# Patient Record
Sex: Female | Born: 1965
Health system: Southern US, Community
[De-identification: ages and names within clinical notes are randomized; demographics above are authoritative.]

## PROBLEM LIST (undated history)

## (undated) DIAGNOSIS — G4733 Obstructive sleep apnea (adult) (pediatric): Secondary | ICD-10-CM

## (undated) DIAGNOSIS — G43909 Migraine, unspecified, not intractable, without status migrainosus: Secondary | ICD-10-CM

## (undated) DIAGNOSIS — Z85828 Personal history of other malignant neoplasm of skin: Secondary | ICD-10-CM

## (undated) DIAGNOSIS — B019 Varicella without complication: Secondary | ICD-10-CM

## (undated) DIAGNOSIS — C801 Malignant (primary) neoplasm, unspecified: Secondary | ICD-10-CM

## (undated) DIAGNOSIS — K51419 Inflammatory polyps of colon with unspecified complications: Secondary | ICD-10-CM

## (undated) DIAGNOSIS — Z853 Personal history of malignant neoplasm of breast: Secondary | ICD-10-CM

## (undated) HISTORY — PX: TONSILLECTOMY AND ADENOIDECTOMY: SHX28

## (undated) HISTORY — DX: Obstructive sleep apnea (adult) (pediatric): G47.33

## (undated) HISTORY — DX: Personal history of malignant neoplasm of breast: Z85.3

## (undated) HISTORY — PX: BREAST LUMPECTOMY: SHX2

## (undated) HISTORY — PX: BREAST BIOPSY: SHX20

## (undated) HISTORY — PX: VARICOSE VEIN SURGERY: SHX832

## (undated) HISTORY — PX: TYMPANOSTOMY TUBE PLACEMENT: SHX32

## (undated) HISTORY — DX: Inflammatory polyps of colon with unspecified complications: K51.419

## (undated) HISTORY — DX: Malignant (primary) neoplasm, unspecified: C80.1

## (undated) HISTORY — DX: Migraine, unspecified, not intractable, without status migrainosus: G43.909

## (undated) HISTORY — PX: INGUINAL HERNIA REPAIR: SUR1180

## (undated) HISTORY — DX: Varicella without complication: B01.9

## (undated) HISTORY — DX: Personal history of other malignant neoplasm of skin: Z85.828

## (undated) HISTORY — PX: NASAL SINUS SURGERY: SHX719

---

## 1998-08-26 LAB — HM HEPATITIS C SCREENING LAB: HM Hepatitis Screen: NEGATIVE

## 1998-08-26 LAB — HM HIV SCREENING LAB: HM HIV Screening: NEGATIVE

## 2019-05-24 DIAGNOSIS — Z6821 Body mass index (BMI) 21.0-21.9, adult: Secondary | ICD-10-CM | POA: Diagnosis not present

## 2019-05-24 DIAGNOSIS — G43009 Migraine without aura, not intractable, without status migrainosus: Secondary | ICD-10-CM | POA: Diagnosis not present

## 2019-05-24 DIAGNOSIS — Z13220 Encounter for screening for lipoid disorders: Secondary | ICD-10-CM | POA: Diagnosis not present

## 2019-05-24 DIAGNOSIS — Z23 Encounter for immunization: Secondary | ICD-10-CM | POA: Diagnosis not present

## 2019-05-24 DIAGNOSIS — Z0000 Encounter for general adult medical examination without abnormal findings: Secondary | ICD-10-CM | POA: Diagnosis not present

## 2019-05-26 DIAGNOSIS — Z131 Encounter for screening for diabetes mellitus: Secondary | ICD-10-CM | POA: Diagnosis not present

## 2019-05-26 DIAGNOSIS — Z0000 Encounter for general adult medical examination without abnormal findings: Secondary | ICD-10-CM | POA: Diagnosis not present

## 2019-05-26 DIAGNOSIS — Z1329 Encounter for screening for other suspected endocrine disorder: Secondary | ICD-10-CM | POA: Diagnosis not present

## 2019-05-26 DIAGNOSIS — Z13220 Encounter for screening for lipoid disorders: Secondary | ICD-10-CM | POA: Diagnosis not present

## 2019-09-01 DIAGNOSIS — G43829 Menstrual migraine, not intractable, without status migrainosus: Secondary | ICD-10-CM | POA: Diagnosis not present

## 2019-09-01 DIAGNOSIS — R030 Elevated blood-pressure reading, without diagnosis of hypertension: Secondary | ICD-10-CM | POA: Diagnosis not present

## 2019-10-20 DIAGNOSIS — L570 Actinic keratosis: Secondary | ICD-10-CM | POA: Diagnosis not present

## 2019-10-20 DIAGNOSIS — D485 Neoplasm of uncertain behavior of skin: Secondary | ICD-10-CM | POA: Diagnosis not present

## 2019-10-20 DIAGNOSIS — L821 Other seborrheic keratosis: Secondary | ICD-10-CM | POA: Diagnosis not present

## 2019-10-20 DIAGNOSIS — C44612 Basal cell carcinoma of skin of right upper limb, including shoulder: Secondary | ICD-10-CM | POA: Diagnosis not present

## 2019-11-03 DIAGNOSIS — Z01419 Encounter for gynecological examination (general) (routine) without abnormal findings: Secondary | ICD-10-CM | POA: Diagnosis not present

## 2019-11-03 DIAGNOSIS — N898 Other specified noninflammatory disorders of vagina: Secondary | ICD-10-CM | POA: Diagnosis not present

## 2019-11-03 DIAGNOSIS — Z853 Personal history of malignant neoplasm of breast: Secondary | ICD-10-CM | POA: Diagnosis not present

## 2019-11-24 DIAGNOSIS — Z853 Personal history of malignant neoplasm of breast: Secondary | ICD-10-CM | POA: Diagnosis not present

## 2019-11-24 DIAGNOSIS — R922 Inconclusive mammogram: Secondary | ICD-10-CM | POA: Diagnosis not present

## 2019-11-24 DIAGNOSIS — Z08 Encounter for follow-up examination after completed treatment for malignant neoplasm: Secondary | ICD-10-CM | POA: Diagnosis not present

## 2019-11-24 DIAGNOSIS — Z9889 Other specified postprocedural states: Secondary | ICD-10-CM | POA: Diagnosis not present

## 2019-12-09 DIAGNOSIS — Z86010 Personal history of colonic polyps: Secondary | ICD-10-CM | POA: Diagnosis not present

## 2019-12-09 LAB — HM COLONOSCOPY

## 2019-12-22 DIAGNOSIS — L309 Dermatitis, unspecified: Secondary | ICD-10-CM | POA: Diagnosis not present

## 2019-12-22 DIAGNOSIS — L821 Other seborrheic keratosis: Secondary | ICD-10-CM | POA: Diagnosis not present

## 2020-04-18 DIAGNOSIS — B078 Other viral warts: Secondary | ICD-10-CM | POA: Diagnosis not present

## 2020-04-18 DIAGNOSIS — L821 Other seborrheic keratosis: Secondary | ICD-10-CM | POA: Diagnosis not present

## 2020-04-18 DIAGNOSIS — L578 Other skin changes due to chronic exposure to nonionizing radiation: Secondary | ICD-10-CM | POA: Diagnosis not present

## 2020-04-18 DIAGNOSIS — D225 Melanocytic nevi of trunk: Secondary | ICD-10-CM | POA: Diagnosis not present

## 2020-04-18 DIAGNOSIS — B351 Tinea unguium: Secondary | ICD-10-CM | POA: Diagnosis not present

## 2020-04-18 DIAGNOSIS — L905 Scar conditions and fibrosis of skin: Secondary | ICD-10-CM | POA: Diagnosis not present

## 2020-04-18 DIAGNOSIS — D485 Neoplasm of uncertain behavior of skin: Secondary | ICD-10-CM | POA: Diagnosis not present

## 2020-04-26 DIAGNOSIS — Z13220 Encounter for screening for lipoid disorders: Secondary | ICD-10-CM | POA: Diagnosis not present

## 2020-04-26 DIAGNOSIS — Z1389 Encounter for screening for other disorder: Secondary | ICD-10-CM | POA: Diagnosis not present

## 2020-04-26 DIAGNOSIS — Z0000 Encounter for general adult medical examination without abnormal findings: Secondary | ICD-10-CM | POA: Diagnosis not present

## 2020-06-28 DIAGNOSIS — G43909 Migraine, unspecified, not intractable, without status migrainosus: Secondary | ICD-10-CM | POA: Diagnosis not present

## 2020-06-28 DIAGNOSIS — Z0000 Encounter for general adult medical examination without abnormal findings: Secondary | ICD-10-CM | POA: Diagnosis not present

## 2020-06-28 DIAGNOSIS — N951 Menopausal and female climacteric states: Secondary | ICD-10-CM | POA: Diagnosis not present

## 2020-09-06 DIAGNOSIS — H6123 Impacted cerumen, bilateral: Secondary | ICD-10-CM | POA: Diagnosis not present

## 2020-10-25 DIAGNOSIS — L308 Other specified dermatitis: Secondary | ICD-10-CM | POA: Diagnosis not present

## 2020-10-25 DIAGNOSIS — L718 Other rosacea: Secondary | ICD-10-CM | POA: Diagnosis not present

## 2020-10-25 DIAGNOSIS — L905 Scar conditions and fibrosis of skin: Secondary | ICD-10-CM | POA: Diagnosis not present

## 2020-10-25 DIAGNOSIS — D225 Melanocytic nevi of trunk: Secondary | ICD-10-CM | POA: Diagnosis not present

## 2020-11-15 DIAGNOSIS — Z01419 Encounter for gynecological examination (general) (routine) without abnormal findings: Secondary | ICD-10-CM | POA: Diagnosis not present

## 2020-11-15 DIAGNOSIS — Z1231 Encounter for screening mammogram for malignant neoplasm of breast: Secondary | ICD-10-CM | POA: Diagnosis not present

## 2020-11-15 DIAGNOSIS — Z8669 Personal history of other diseases of the nervous system and sense organs: Secondary | ICD-10-CM | POA: Diagnosis not present

## 2020-11-15 LAB — HM PAP SMEAR: HM Pap smear: NORMAL

## 2021-01-08 DIAGNOSIS — E538 Deficiency of other specified B group vitamins: Secondary | ICD-10-CM | POA: Diagnosis not present

## 2021-01-08 DIAGNOSIS — E038 Other specified hypothyroidism: Secondary | ICD-10-CM | POA: Diagnosis not present

## 2021-01-08 DIAGNOSIS — E559 Vitamin D deficiency, unspecified: Secondary | ICD-10-CM | POA: Diagnosis not present

## 2021-01-08 DIAGNOSIS — G43839 Menstrual migraine, intractable, without status migrainosus: Secondary | ICD-10-CM | POA: Diagnosis not present

## 2021-03-08 DIAGNOSIS — Z1231 Encounter for screening mammogram for malignant neoplasm of breast: Secondary | ICD-10-CM | POA: Diagnosis not present

## 2021-03-21 ENCOUNTER — Other Ambulatory Visit: Payer: Self-pay

## 2021-03-22 ENCOUNTER — Encounter: Payer: Self-pay | Admitting: Family Medicine

## 2021-03-22 ENCOUNTER — Ambulatory Visit (INDEPENDENT_AMBULATORY_CARE_PROVIDER_SITE_OTHER): Payer: BC Managed Care – PPO | Admitting: Family Medicine

## 2021-03-22 VITALS — BP 122/82 | HR 68 | Temp 98.4°F | Ht 70.0 in | Wt 166.0 lb

## 2021-03-22 DIAGNOSIS — G43909 Migraine, unspecified, not intractable, without status migrainosus: Secondary | ICD-10-CM

## 2021-03-22 DIAGNOSIS — Z8249 Family history of ischemic heart disease and other diseases of the circulatory system: Secondary | ICD-10-CM | POA: Diagnosis not present

## 2021-03-22 DIAGNOSIS — Z0000 Encounter for general adult medical examination without abnormal findings: Secondary | ICD-10-CM

## 2021-03-22 DIAGNOSIS — M79673 Pain in unspecified foot: Secondary | ICD-10-CM

## 2021-03-22 DIAGNOSIS — Z7189 Other specified counseling: Secondary | ICD-10-CM

## 2021-03-22 NOTE — Progress Notes (Signed)
This visit occurred during the SARS-CoV-2 public health emergency.  Safety protocols were in place, including screening questions prior to the visit, additional usage of staff PPE, and extensive cleaning of exam room while observing appropriate contact time as indicated for disinfecting solutions.  New patient.  Requesting old records.  Family history of heart disease.  Discussed potentially getting calcium scoring done.  Requesting records from previous doctor.  No chest pain shortness of breath or swelling.  Migraines.  Followed by neurology.  Imitrex helps.  Used prn.  This may be menstrual related and may improve as she goes through menopause.  Discussed.  History of right fifth toe fracture years ago.  She has orthotics to use.  She still having some discomfort episodically.   HIV and hepatitis C screening done in the 2000's, negative per patient report. Vaccine history updated. Living will d/w pt.  Husband then brother Mikki Santee designated if patient were incapacitated.   Pap done 10/2020.   Colonoscopy 2021 Diet and exercise d/w pt  Meds, vitals, and allergies reviewed.   ROS: Per HPI unless specifically indicated in ROS section   GEN: nad, alert and oriented HEENT: ncat NECK: supple w/o LA CV: rrr. PULM: ctab, no inc wob ABD: soft, +bs EXT: no edema SKIN: no acute rash Loss of right transverse arch.  Intact dorsalis pedis pulse.  Not tender palpation on any bony prominences.  No rash.  No bruising.

## 2021-03-22 NOTE — Patient Instructions (Signed)
Let me see about options for your foot.  I'll check on your old records and update your chart.   Let me give some consideration to calcium scoring.  Take care.  Glad to see you.

## 2021-03-26 ENCOUNTER — Encounter: Payer: Self-pay | Admitting: Family Medicine

## 2021-03-27 ENCOUNTER — Encounter: Payer: Self-pay | Admitting: Family Medicine

## 2021-03-27 DIAGNOSIS — Z0000 Encounter for general adult medical examination without abnormal findings: Secondary | ICD-10-CM | POA: Insufficient documentation

## 2021-03-27 DIAGNOSIS — Z853 Personal history of malignant neoplasm of breast: Secondary | ICD-10-CM | POA: Insufficient documentation

## 2021-03-27 DIAGNOSIS — Z7189 Other specified counseling: Secondary | ICD-10-CM | POA: Insufficient documentation

## 2021-03-27 DIAGNOSIS — G43909 Migraine, unspecified, not intractable, without status migrainosus: Secondary | ICD-10-CM | POA: Insufficient documentation

## 2021-03-27 DIAGNOSIS — Z85828 Personal history of other malignant neoplasm of skin: Secondary | ICD-10-CM | POA: Insufficient documentation

## 2021-03-27 DIAGNOSIS — Z8249 Family history of ischemic heart disease and other diseases of the circulatory system: Secondary | ICD-10-CM | POA: Insufficient documentation

## 2021-03-27 DIAGNOSIS — M79673 Pain in unspecified foot: Secondary | ICD-10-CM | POA: Insufficient documentation

## 2021-03-27 NOTE — Assessment & Plan Note (Signed)
No chest pain.  Requesting old records.  I like to consider options.  Calcium scoring may be reasonable given her age and family history.  Discussed.

## 2021-03-27 NOTE — Assessment & Plan Note (Signed)
HIV and hepatitis C screening done in the 2000's, negative per patient report. Vaccine history updated. Living will d/w pt.  Husband then brother Mikki Santee designated if patient were incapacitated.   Pap done 10/2020.   Colonoscopy 2021 Diet and exercise d/w pt

## 2021-03-27 NOTE — Assessment & Plan Note (Signed)
Per neurology.  This may decrease that she goes to menopause.  Using Imitrex..  Adding magnesium help.

## 2021-03-27 NOTE — Assessment & Plan Note (Signed)
On to consider options about podiatry versus sports medicine and we will contact patient.

## 2021-03-27 NOTE — Assessment & Plan Note (Signed)
Living will d/w pt.  Husband then brother Mikki Santee designated if patient were incapacitated.

## 2021-03-28 ENCOUNTER — Other Ambulatory Visit: Payer: Self-pay | Admitting: Family Medicine

## 2021-03-28 DIAGNOSIS — Z8249 Family history of ischemic heart disease and other diseases of the circulatory system: Secondary | ICD-10-CM

## 2021-04-09 ENCOUNTER — Ambulatory Visit (INDEPENDENT_AMBULATORY_CARE_PROVIDER_SITE_OTHER): Payer: BC Managed Care – PPO | Admitting: Family Medicine

## 2021-04-09 ENCOUNTER — Encounter: Payer: Self-pay | Admitting: Family Medicine

## 2021-04-09 ENCOUNTER — Other Ambulatory Visit: Payer: Self-pay

## 2021-04-09 VITALS — BP 102/70 | HR 72 | Temp 97.0°F | Ht 70.0 in | Wt 164.5 lb

## 2021-04-09 DIAGNOSIS — M7741 Metatarsalgia, right foot: Secondary | ICD-10-CM | POA: Diagnosis not present

## 2021-04-09 DIAGNOSIS — M722 Plantar fascial fibromatosis: Secondary | ICD-10-CM | POA: Diagnosis not present

## 2021-04-09 NOTE — Progress Notes (Signed)
Diane Rosero T. Shirlee Whitmire, MD, [place] at [facility], [address]  Phone: [phone]  FAX: [phone]  Diane White - 55 y.o. female  MRN [id number]  Date of Birth: 10-04-1965  Date: 04/09/2021  PCP: Tonia Ghent, MD  Referral: Tonia Ghent, MD  Chief Complaint  Patient presents with   Foot Pain    Right-Check Orthotic    This visit occurred during the SARS-CoV-2 public health emergency.  Safety protocols were in place, including screening questions prior to the visit, additional usage of staff PPE, and extensive cleaning of exam room while observing appropriate contact time as indicated for disinfecting solutions.   Subjective:   Diane White is a 55 y.o. very pleasant female patient with Body mass index is 23.6 kg/m. who presents with the following:  R foot pain:   She is here with some ongoing right intermittent foot pain.  She does have some pain around the lateral aspect of her foot approximately around the fourth and fifth metatarsal shafts distally.  She did have a remote fifth digit fracture on the right as well.  She also has some discomfort on the bottom of her right foot.  Nevertheless she is able to work out essentially every day and she is very active doing almost anything that she would like.  She does have 2 sets of orthotics that have a cork base as well as a Poron like material that is very forgiving  Last week and did not really bother her at all.   Orthotics look great  Tried a MT pad?  Review of Systems is noted in the HPI, as appropriate  Objective:   BP 102/70   Pulse 72   Temp (!) 97 F (36.1 C) (Temporal)   Ht '5\' 10"'$  (1.778 m)   Wt 164 lb 8 oz (74.6 kg)   SpO2 98%   BMI 23.60 kg/m   GEN: No acute distress; alert,appropriate. PULM: Breathing comfortably in no respiratory distress PSYCH: Normally interactive.   Bilaterally she does have almost complete  transverse arch collapse with bunion and notable bunionette formation with all metatarsal heads dropped.  She does have a natural cavus foot, but she does pronate on ambulation right greater than left.  Hindfoot appears to be stable and preserved.  Entirety of her bony foot and ankle exam is nontender for out bony pathology, all ligaments are stable.  Stable drawer testing and subtalar tilt testing.  Achilles, peroneal and other tibial tendons as well as all others appear grossly unremarkable.  She does have some pain about in the interdigital webspace and just caudal to this between the fourth and fifth.  She has some early thickening on the medial aspect in the midpoint of the plantar fascia.  Laboratory and Imaging Data:  Assessment and Plan:     ICD-10-CM   1. Metatarsalgia of right foot  M77.41     2. Plantar fascial fibromatosis of right foot  M72.2      I think that her orthotics would great.  If he will look really well, she has a nice cushioned orthotic that will be helpful for exercise.  She does have a thickening of the plantar fascia on the right which is not perceptible on the left.  She does not have a clear large fibroma, but there is a subtle change, and this will would likely be the cause of her pain in this region.  Transverse arch  collapse.  I think that she probably has some early neuroma formation at the very least some compression between 3 4 and 4 5.  I will do a trial of some metatarsal pads to see if this  Overall, offered reassurance.  I do not think that these are anything particularly worrisome, but they may bother her somewhat off and on.  Dragon Medical One speech-to-text software was used for transcription in this dictation.  Possible transcriptional errors can occur using Editor, commissioning.   Signed,  Maud Deed. Madalen Gavin, MD   Outpatient Encounter Medications as of 04/09/2021  Medication Sig   Calcium-Vitamin D-Vitamin K (CALCIUM SOFT CHEWS) [phone]  MG-UNT-MCG CHEW Chew 1 tablet by mouth daily.   Cholecalciferol 50 MCG (2000 UT) CAPS Take by mouth.   Magnesium 400 MG CAPS Take by mouth.   SUMAtriptan (IMITREX) 50 MG tablet Take by mouth.   No facility-administered encounter medications on file as of 04/09/2021.

## 2021-04-11 DIAGNOSIS — X32XXXA Exposure to sunlight, initial encounter: Secondary | ICD-10-CM | POA: Diagnosis not present

## 2021-04-11 DIAGNOSIS — L718 Other rosacea: Secondary | ICD-10-CM | POA: Diagnosis not present

## 2021-04-11 DIAGNOSIS — L570 Actinic keratosis: Secondary | ICD-10-CM | POA: Diagnosis not present

## 2021-04-13 ENCOUNTER — Ambulatory Visit
Admission: RE | Admit: 2021-04-13 | Discharge: 2021-04-13 | Disposition: A | Discharge status: Home / Self Care | Payer: BC Managed Care – PPO | Source: Ambulatory Visit | Attending: Family Medicine | Admitting: Family Medicine

## 2021-04-13 ENCOUNTER — Other Ambulatory Visit: Payer: Self-pay

## 2021-04-13 DIAGNOSIS — Z8249 Family history of ischemic heart disease and other diseases of the circulatory system: Secondary | ICD-10-CM | POA: Insufficient documentation

## 2021-05-04 ENCOUNTER — Ambulatory Visit: Payer: Self-pay | Admitting: Family Medicine

## 2021-05-15 ENCOUNTER — Encounter: Payer: Self-pay | Admitting: Family Medicine

## 2021-05-21 ENCOUNTER — Encounter: Payer: Self-pay | Admitting: Family Medicine

## 2021-05-23 DIAGNOSIS — R0683 Snoring: Secondary | ICD-10-CM | POA: Diagnosis not present

## 2021-05-23 DIAGNOSIS — G43839 Menstrual migraine, intractable, without status migrainosus: Secondary | ICD-10-CM | POA: Diagnosis not present

## 2021-06-08 ENCOUNTER — Encounter: Payer: Self-pay | Admitting: Family Medicine

## 2021-09-17 DIAGNOSIS — F4323 Adjustment disorder with mixed anxiety and depressed mood: Secondary | ICD-10-CM | POA: Diagnosis not present

## 2021-09-24 DIAGNOSIS — F4323 Adjustment disorder with mixed anxiety and depressed mood: Secondary | ICD-10-CM | POA: Diagnosis not present

## 2021-10-01 DIAGNOSIS — F4323 Adjustment disorder with mixed anxiety and depressed mood: Secondary | ICD-10-CM | POA: Diagnosis not present

## 2021-10-08 DIAGNOSIS — F4323 Adjustment disorder with mixed anxiety and depressed mood: Secondary | ICD-10-CM | POA: Diagnosis not present

## 2021-10-12 DIAGNOSIS — X32XXXA Exposure to sunlight, initial encounter: Secondary | ICD-10-CM | POA: Diagnosis not present

## 2021-10-12 DIAGNOSIS — L570 Actinic keratosis: Secondary | ICD-10-CM | POA: Diagnosis not present

## 2021-10-12 DIAGNOSIS — Z85828 Personal history of other malignant neoplasm of skin: Secondary | ICD-10-CM | POA: Diagnosis not present

## 2021-10-12 DIAGNOSIS — L718 Other rosacea: Secondary | ICD-10-CM | POA: Diagnosis not present

## 2021-10-15 DIAGNOSIS — F4323 Adjustment disorder with mixed anxiety and depressed mood: Secondary | ICD-10-CM | POA: Diagnosis not present

## 2021-11-07 DIAGNOSIS — F4323 Adjustment disorder with mixed anxiety and depressed mood: Secondary | ICD-10-CM | POA: Diagnosis not present

## 2021-11-13 ENCOUNTER — Other Ambulatory Visit (INDEPENDENT_AMBULATORY_CARE_PROVIDER_SITE_OTHER): Payer: BC Managed Care – PPO

## 2021-11-13 ENCOUNTER — Other Ambulatory Visit: Payer: Self-pay | Admitting: Family Medicine

## 2021-11-13 ENCOUNTER — Encounter: Payer: Self-pay | Admitting: Family Medicine

## 2021-11-13 ENCOUNTER — Other Ambulatory Visit: Payer: Self-pay

## 2021-11-13 DIAGNOSIS — Z8249 Family history of ischemic heart disease and other diseases of the circulatory system: Secondary | ICD-10-CM | POA: Diagnosis not present

## 2021-11-13 DIAGNOSIS — E785 Hyperlipidemia, unspecified: Secondary | ICD-10-CM

## 2021-11-13 LAB — LIPID PANEL
Cholesterol: 203 mg/dL — ABNORMAL HIGH (ref 0–200)
HDL: 90.3 mg/dL (ref >39.00)
LDL Cholesterol: 102 mg/dL — ABNORMAL HIGH (ref 0–99)
NonHDL: 112.96
Total CHOL/HDL Ratio: 2
Triglycerides: 53 mg/dL (ref 0.0–149.0)
VLDL: 10.6 mg/dL (ref 0.0–40.0)

## 2021-11-13 LAB — COMPREHENSIVE METABOLIC PANEL
ALT: 12 U/L (ref 0–35)
AST: 20 U/L (ref 0–37)
Albumin: 4.6 g/dL (ref 3.5–5.2)
Alkaline Phosphatase: 43 U/L (ref 39–117)
BUN: 14 mg/dL (ref 6–23)
CO2: 31 mEq/L (ref 19–32)
Calcium: 9.9 mg/dL (ref 8.4–10.5)
Chloride: 101 mEq/L (ref 96–112)
Creatinine, Ser: 0.92 mg/dL (ref 0.40–1.20)
GFR: 69.69 mL/min (ref >60.00)
Glucose, Bld: 85 mg/dL (ref 70–99)
Potassium: 4.4 mEq/L (ref 3.5–5.1)
Sodium: 137 mEq/L (ref 135–145)
Total Bilirubin: 0.8 mg/dL (ref 0.2–1.2)
Total Protein: 6.9 g/dL (ref 6.0–8.3)

## 2021-11-13 LAB — CBC WITH DIFFERENTIAL/PLATELET
Basophils Absolute: 0 10*3/uL (ref 0.0–0.1)
Basophils Relative: 0.6 % (ref 0.0–3.0)
Eosinophils Absolute: 0 10*3/uL (ref 0.0–0.7)
Eosinophils Relative: 1 % (ref 0.0–5.0)
HCT: 39.6 % (ref 36.0–46.0)
Hemoglobin: 13.3 g/dL (ref 12.0–15.0)
Lymphocytes Relative: 29.3 % (ref 12.0–46.0)
Lymphs Abs: 1.2 10*3/uL (ref 0.7–4.0)
MCHC: 33.5 g/dL (ref 30.0–36.0)
MCV: 89.5 fl (ref 78.0–100.0)
Monocytes Absolute: 0.3 10*3/uL (ref 0.1–1.0)
Monocytes Relative: 6.7 % (ref 3.0–12.0)
Neutro Abs: 2.5 10*3/uL (ref 1.4–7.7)
Neutrophils Relative %: 62.4 % (ref 43.0–77.0)
Platelets: 156 10*3/uL (ref 150.0–400.0)
RBC: 4.42 Mil/uL (ref 3.87–5.11)
RDW: 13.7 % (ref 11.5–15.5)
WBC: 4 10*3/uL (ref 4.0–10.5)

## 2021-11-16 ENCOUNTER — Other Ambulatory Visit: Payer: BC Managed Care – PPO

## 2021-11-19 ENCOUNTER — Other Ambulatory Visit: Payer: BC Managed Care – PPO

## 2021-11-23 ENCOUNTER — Other Ambulatory Visit: Payer: BC Managed Care – PPO

## 2021-11-26 ENCOUNTER — Encounter: Payer: Self-pay | Admitting: Family Medicine

## 2021-11-26 ENCOUNTER — Ambulatory Visit (INDEPENDENT_AMBULATORY_CARE_PROVIDER_SITE_OTHER): Payer: BC Managed Care – PPO | Admitting: Family Medicine

## 2021-11-26 VITALS — BP 132/72 | HR 78 | Temp 98.3°F | Ht 70.0 in | Wt 166.0 lb

## 2021-11-26 DIAGNOSIS — G43909 Migraine, unspecified, not intractable, without status migrainosus: Secondary | ICD-10-CM

## 2021-11-26 DIAGNOSIS — Z0000 Encounter for general adult medical examination without abnormal findings: Secondary | ICD-10-CM

## 2021-11-26 DIAGNOSIS — H9190 Unspecified hearing loss, unspecified ear: Secondary | ICD-10-CM

## 2021-11-26 DIAGNOSIS — Z7189 Other specified counseling: Secondary | ICD-10-CM

## 2021-11-26 DIAGNOSIS — E785 Hyperlipidemia, unspecified: Secondary | ICD-10-CM

## 2021-11-26 MED ORDER — MAGNESIUM 100 MG PO TABS: 200.0000 mg | ORAL_TABLET | Freq: Every day | ORAL | Status: AC

## 2021-11-26 NOTE — Progress Notes (Signed)
CPE- See plan.  Routine anticipatory guidance given to patient.  See health maintenance.  The possibility exists that previously documented standard health maintenance information may have been brought forward from a previous encounter into this note.  If needed, that same information has been updated to reflect the current situation based on today's encounter.   ? ?HIV and hepatitis C screening done in the 2000's, negative per patient report. ?Vaccine history d/w pt.   ?Living will d/w pt.  Husband then brother Mikki Santee designated if patient were incapacitated.   ?Pap done 10/2020.   ?Colonoscopy 2021 ?Diet and exercise d/w pt.  Doing well with both.  She is walking 5 miles a day at baseline.   ? ?She has some hearing loss R ear at baseline, d/w pt about ENT referral.   ? ?Migraines d/w pt.  She had neuro eval.  She is taking '200mg'$  magnesium and that helped with prevention.  Rare imitrex use.   ? ?Prev labs d/w pt.  HLD d/w pt.   ? ?The 10-year ASCVD risk score (Arnett DK, et al., 2019) is: 1.5% ?  Values used to calculate the score: ?    Age: 56 years ?    Sex: Female ?    Is Non-Hispanic African American: No ?    Diabetic: No ?    Tobacco smoker: No ?    Systolic Blood Pressure: 481 mmHg ?    Is BP treated: No ?    HDL Cholesterol: 90.3 mg/dL ?    Total Cholesterol: 203 mg/dL ? ?She noted a change in sweat odor.  D/w pt about changing soap.  Unclear if from magnesium. D/w pt.    ? ?PMH and SH reviewed ?Meds, vitals, and allergies reviewed.  ? ?ROS: Per HPI.  Unless specifically indicated otherwise in HPI, the patient denies: ? ?General: fever. ?Eyes: acute vision changes ?ENT: sore throat ?Cardiovascular: chest pain ?Respiratory: SOB ?GI: vomiting ?GU: dysuria ?Musculoskeletal: acute back pain ?Derm: acute rash ?Neuro: acute motor dysfunction ?Psych: worsening mood ?Endocrine: polydipsia ?Heme: bleeding ?Allergy: hayfever ? ?GEN: nad, alert and oriented ?HEENT: ncat ?NECK: supple w/o LA ?CV: rrr. ?PULM: ctab, no inc  wob ?ABD: soft, +bs ?EXT: no edema ?SKIN: no acute rash ?

## 2021-11-26 NOTE — Patient Instructions (Addendum)
Update me as needed.  ?Take care.  Glad to see you. ?Thanks for your effort.   ?You could try cutting back on magnesium to see if your sweat normalized.   ?We'll call about seeing ENT.   ?

## 2021-11-28 DIAGNOSIS — Z7189 Other specified counseling: Secondary | ICD-10-CM | POA: Insufficient documentation

## 2021-11-28 DIAGNOSIS — E785 Hyperlipidemia, unspecified: Secondary | ICD-10-CM | POA: Insufficient documentation

## 2021-11-28 DIAGNOSIS — H9190 Unspecified hearing loss, unspecified ear: Secondary | ICD-10-CM | POA: Insufficient documentation

## 2021-11-28 NOTE — Assessment & Plan Note (Signed)
Living will d/w pt.  Husband then brother Mikki Santee designated if patient were incapacitated.   ?

## 2021-11-28 NOTE — Assessment & Plan Note (Addendum)
Migraines d/w pt.  She had neuro eval.  She is taking '200mg'$  magnesium and that helped with prevention.  Rare imitrex use.   She noted a change in sweat odor.  D/w pt about changing soap.  Unclear if from magnesium.  Discussed with patient that she can try tapering magnesium to see if that had any effect.  She can update me as needed. ?

## 2021-11-28 NOTE — Assessment & Plan Note (Signed)
Prev labs d/w pt.  HLD d/w pt.   ? ?The 10-year ASCVD risk score (Arnett DK, et al., 2019) is: 1.5% ?  Values used to calculate the score: ?    Age: 56 years ?    Sex: Female ?    Is Non-Hispanic African American: No ?    Diabetic: No ?    Tobacco smoker: No ?    Systolic Blood Pressure: 441 mmHg ?    Is BP treated: No ?    HDL Cholesterol: 90.3 mg/dL ?    Total Cholesterol: 203 mg/dL ? ?Previous coronary scoring discussed with patient ?IMPRESSION AND RECOMMENDATION: ?1. Normal coronary calcium score of 0. Patient is low risk for ?coronary events. ?? ?2. CAC 0, CAC-DRS A0. ?? ?3. Continue heart healthy lifestyle and risk factor modification. ?? ?

## 2021-11-28 NOTE — Assessment & Plan Note (Signed)
Refer to ENT

## 2021-11-28 NOTE — Assessment & Plan Note (Signed)
HIV and hepatitis C screening done in the 2000's, negative per patient report. ?Vaccine history d/w pt.   ?Living will d/w pt.  Husband then brother Mikki Santee designated if patient were incapacitated.   ?Pap done 10/2020.   ?Colonoscopy 2021 ?Diet and exercise d/w pt.  Doing well with both.  She is walking 5 miles a day at baseline.   ?

## 2021-12-10 ENCOUNTER — Encounter: Payer: Self-pay | Admitting: *Deleted

## 2021-12-10 DIAGNOSIS — F4323 Adjustment disorder with mixed anxiety and depressed mood: Secondary | ICD-10-CM | POA: Diagnosis not present

## 2021-12-19 DIAGNOSIS — Z1331 Encounter for screening for depression: Secondary | ICD-10-CM | POA: Diagnosis not present

## 2021-12-19 DIAGNOSIS — Z01419 Encounter for gynecological examination (general) (routine) without abnormal findings: Secondary | ICD-10-CM | POA: Diagnosis not present

## 2021-12-19 DIAGNOSIS — Z1231 Encounter for screening mammogram for malignant neoplasm of breast: Secondary | ICD-10-CM | POA: Diagnosis not present

## 2021-12-19 LAB — HM MAMMOGRAPHY

## 2022-01-14 DIAGNOSIS — H6123 Impacted cerumen, bilateral: Secondary | ICD-10-CM | POA: Diagnosis not present

## 2022-01-14 DIAGNOSIS — H9011 Conductive hearing loss, unilateral, right ear, with unrestricted hearing on the contralateral side: Secondary | ICD-10-CM | POA: Diagnosis not present

## 2022-01-29 DIAGNOSIS — D0462 Carcinoma in situ of skin of left upper limb, including shoulder: Secondary | ICD-10-CM | POA: Diagnosis not present

## 2022-01-29 DIAGNOSIS — D0461 Carcinoma in situ of skin of right upper limb, including shoulder: Secondary | ICD-10-CM | POA: Diagnosis not present

## 2022-01-29 DIAGNOSIS — L570 Actinic keratosis: Secondary | ICD-10-CM | POA: Diagnosis not present

## 2022-01-29 DIAGNOSIS — D485 Neoplasm of uncertain behavior of skin: Secondary | ICD-10-CM | POA: Diagnosis not present

## 2022-02-18 DIAGNOSIS — D0439 Carcinoma in situ of skin of other parts of face: Secondary | ICD-10-CM | POA: Diagnosis not present

## 2022-02-18 DIAGNOSIS — L988 Other specified disorders of the skin and subcutaneous tissue: Secondary | ICD-10-CM | POA: Diagnosis not present

## 2022-02-18 DIAGNOSIS — L578 Other skin changes due to chronic exposure to nonionizing radiation: Secondary | ICD-10-CM | POA: Diagnosis not present

## 2022-02-18 DIAGNOSIS — L814 Other melanin hyperpigmentation: Secondary | ICD-10-CM | POA: Diagnosis not present

## 2022-03-02 ENCOUNTER — Encounter: Payer: Self-pay | Admitting: Family Medicine

## 2022-03-06 ENCOUNTER — Other Ambulatory Visit: Payer: Self-pay | Admitting: Family Medicine

## 2022-03-06 MED ORDER — SUMATRIPTAN SUCCINATE 50 MG PO TABS: ORAL_TABLET | ORAL | 5 refills | Status: DC

## 2022-03-11 ENCOUNTER — Encounter: Payer: Self-pay | Admitting: Family Medicine

## 2022-03-11 ENCOUNTER — Ambulatory Visit (INDEPENDENT_AMBULATORY_CARE_PROVIDER_SITE_OTHER)
Admission: RE | Admit: 2022-03-11 | Discharge: 2022-03-11 | Disposition: A | Discharge status: Home / Self Care | Payer: BC Managed Care – PPO | Source: Ambulatory Visit | Attending: Family Medicine | Admitting: Family Medicine

## 2022-03-11 ENCOUNTER — Ambulatory Visit (INDEPENDENT_AMBULATORY_CARE_PROVIDER_SITE_OTHER): Payer: BC Managed Care – PPO | Admitting: Family Medicine

## 2022-03-11 VITALS — BP 120/74 | HR 64 | Temp 98.1°F | Ht 70.0 in | Wt 168.0 lb

## 2022-03-11 DIAGNOSIS — M542 Cervicalgia: Secondary | ICD-10-CM

## 2022-03-11 DIAGNOSIS — M25511 Pain in right shoulder: Secondary | ICD-10-CM

## 2022-03-11 DIAGNOSIS — G8929 Other chronic pain: Secondary | ICD-10-CM

## 2022-03-11 DIAGNOSIS — M19011 Primary osteoarthritis, right shoulder: Secondary | ICD-10-CM | POA: Diagnosis not present

## 2022-03-11 NOTE — Progress Notes (Signed)
Shiasia Porro T. Shawnay Bramel, MD, [place] at [facility] [place], [address]  Phone: [phone]  FAX: [phone]  Diane White - 56 y.o. female  MRN [id number]  Date of Birth: 1965/12/09  Date: 03/11/2022  PCP: Tonia Ghent, MD  Referral: Tonia Ghent, MD  Chief Complaint  Patient presents with   Shoulder Pain    Right   Subjective:   Diane White is a 56 y.o. very pleasant female patient with Body mass index is 24.11 kg/m. who presents with the following:  For a few weeks, patient has been having some posterior neck pain, shoulder blade and arm pain.  She has not done anything specific, but she does recall after she was working out on the rowing machine that she thinks she could have irritated it.  Maybe did something on the rowing machine.  Took it easy for a couple of weeks.  Then shoulder started to feel better, and then last week started to get from her neck. Sometimes it will keep it up at night.    Neck and shoulder.  Not a constant pain. Was working on the floor last night, was sore from crawling on the floor - no upper body work at [employer]. Felt like last week her neck had been stiff.  Last night it was the back of her shoulder.  Started back on the rowing machine for about ten minutes or so before.  As a kid got hit in the neck by a frisbee.  Has had some neck and shoulder issues.  Off and on for a long time.  She does not describe any numbness, tingling, radicular symptoms, but she does have some pain that goes down into the arm.  She does not have any restriction of motion.  Strength is excellent.    Review of Systems is noted in the HPI, as appropriate  Objective:   BP 120/74   Pulse 64   Temp 98.1 F (36.7 C) (Oral)   Ht '5\' 10"'$  (1.778 m)   Wt 168 lb (76.2 kg)   SpO2 96%   BMI 24.11 kg/m   GEN: alert,appropriate PSYCH: Normally interactive. Cooperative during the interview.    CERVICAL SPINE EXAM Range of motion: Flexion, extension, lateral bending, and rotation: Patient lacks roughly 30% to wall motion in all directions of the spine Pain with terminal motion: Mild Spinous Processes: NT SCM: NT Upper paracervical muscles: Tender to palpation, right greater than left Upper traps: Upper and mid trap were mildly tender to palpation C5-T1 intact, sensation and motor    Shoulder: R Inspection: No muscle wasting or winging Ecchymosis/edema: neg  AC joint, scapula, clavicle: NT Spurling's: neg Abduction: full, 5/5 Flexion: full, 5/5 IR, full, lift-off: 5/5 ER at neutral: full, 5/5 AC crossover and compression: neg Neer: neg Hawkins: neg Drop Test: neg Empty Can: neg Supraspinatus insertion: NT Bicipital groove: NT Speed's: neg Yergason's: neg Sulcus sign: neg Scapular dyskinesis: none C5-T1 intact Sensation intact Grip 5/5   Laboratory and Imaging Data: Xrays: Shoulder series Indication: shoulder pain Findings: No evidence of occult fracture No significant glenohumeral arthritis AC joint: no major arthropathy, space preserved Impingement pathology: none significant, Type I Acromium  Electronically Signed  By: Owens Loffler, MD On: 03/11/2022  2:40 PM EDT  The radiological images were independently reviewed by myself in the office and results were reviewed with the patient. My independent interpretation of images:  above  Assessment and Plan:  ICD-10-CM   1. Cervicalgia  M54.2     2. Chronic right shoulder pain  M25.511 DG Shoulder Right   G89.29      Acute on chronic neck pain and shoulder blade with exacerbation.  I think that she exacerbated some ongoing neck pain, and she has some restriction of motion in all directions of the neck.  Pain appears to be primarily referred from the neck into the shoulder blade region and some down into the arm.  Range of motion and strength are excellent in the shoulder and I cannot provoke any  impingement symptoms or cuff pathology.  I gave her a McKenzie style protocol as well as range of motion and strengthening from [employer] and the [employer] orth[employer] surgeons.  Prognosis is excellent.  Social: Right now she is having some limitation in her ability to fully exercise  Medication Management during today's office visit: No orders of the defined types were placed in this encounter.  Medications Discontinued During This Encounter  Medication Reason   fluorouracil (EFUDEX) 5 % cream     Orders placed today for conditions managed today: Orders Placed This Encounter  Procedures   DG Shoulder Right    Follow-up if needed: No follow-ups on file.  Dragon [employer] One speech-to-text software was used for transcription in this dictation.  Possible transcriptional errors can occur using Editor, commissioning.   Signed,  Maud Deed. Tassie Pollett, MD   Outpatient Encounter Medications as of 03/11/2022  Medication Sig   Calcium-Vitamin D-Vitamin K (CALCIUM SOFT CHEWS) [phone] MG-UNT-MCG CHEW Chew 1 tablet by mouth daily.   Cholecalciferol 50 MCG (2000 UT) CAPS Take by mouth.   Magnesium 100 MG TABS Take 2 tablets (200 mg total) by mouth daily.   SUMAtriptan (IMITREX) 50 MG tablet Take as needed for migraine.  Can repeat with second dose in 2 hours if needed.  Max 2 doses in 24 hours.   [DISCONTINUED] fluorouracil (EFUDEX) 5 % cream Apply topically.   No facility-administered encounter medications on file as of 03/11/2022.

## 2022-03-15 DIAGNOSIS — Z1231 Encounter for screening mammogram for malignant neoplasm of breast: Secondary | ICD-10-CM | POA: Diagnosis not present

## 2022-04-24 DIAGNOSIS — D2261 Melanocytic nevi of right upper limb, including shoulder: Secondary | ICD-10-CM | POA: Diagnosis not present

## 2022-04-24 DIAGNOSIS — D0462 Carcinoma in situ of skin of left upper limb, including shoulder: Secondary | ICD-10-CM | POA: Diagnosis not present

## 2022-04-24 DIAGNOSIS — L298 Other pruritus: Secondary | ICD-10-CM | POA: Diagnosis not present

## 2022-04-24 DIAGNOSIS — X32XXXA Exposure to sunlight, initial encounter: Secondary | ICD-10-CM | POA: Diagnosis not present

## 2022-04-24 DIAGNOSIS — L718 Other rosacea: Secondary | ICD-10-CM | POA: Diagnosis not present

## 2022-04-24 DIAGNOSIS — L820 Inflamed seborrheic keratosis: Secondary | ICD-10-CM | POA: Diagnosis not present

## 2022-04-24 DIAGNOSIS — D2262 Melanocytic nevi of left upper limb, including shoulder: Secondary | ICD-10-CM | POA: Diagnosis not present

## 2022-04-24 DIAGNOSIS — D225 Melanocytic nevi of trunk: Secondary | ICD-10-CM | POA: Diagnosis not present

## 2022-04-24 DIAGNOSIS — L570 Actinic keratosis: Secondary | ICD-10-CM | POA: Diagnosis not present

## 2022-04-24 DIAGNOSIS — L538 Other specified erythematous conditions: Secondary | ICD-10-CM | POA: Diagnosis not present

## 2022-04-30 IMAGING — CT CT CARDIAC CORONARY ARTERY CALCIUM SCORE
3 series · 14 of 20 positions shown, 16 images · non-contrast
Comparison: None.
COMPARISON: None.

Addendum:
EXAM:
OVER-READ INTERPRETATION  CT CHEST

The following report is an over-read performed by radiologist Dr.
Mardiwansyah Miharja [REDACTED] on 04/13/2021. This
over-read does not include interpretation of cardiac or coronary
anatomy or pathology. The coronary calcium score interpretation by
the cardiologist is attached.
CLINICAL DATA: Risk stratification
Coronary Calcium Score
TECHNIQUE: The patient was scanned on a Siemens Somatom go.Top Scanner. Axial
non-contrast 3 mm slices were carried out through the heart. The
data set was analyzed on a dedicated work station and scored using
the Agatson method.

[Series 2: sa36 calcium scoring 3.00 · axial · 0.30mm/px · z∈[-1134,-1053]mm · 4 of 46 slices shown]
[im 10/46  vessel]
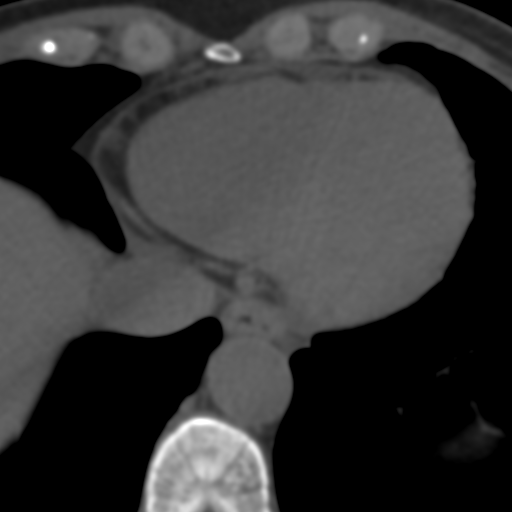
[im 19/46  vessel]
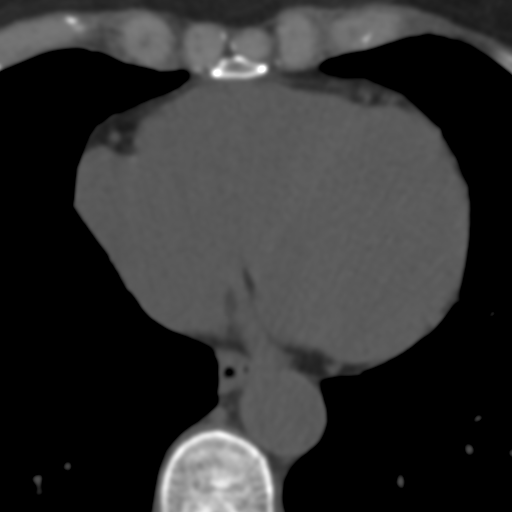
[im 28/46  vessel]
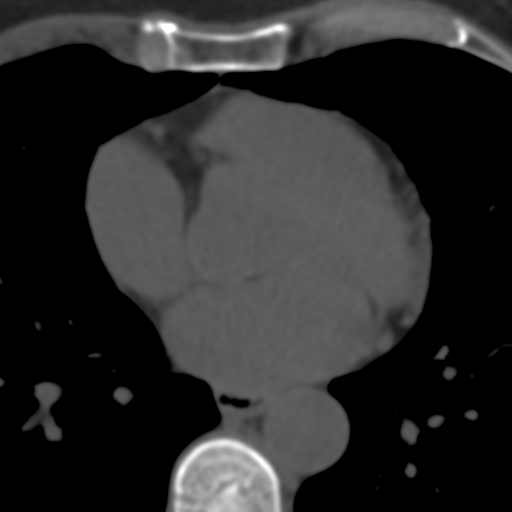
[im 37/46  vessel]
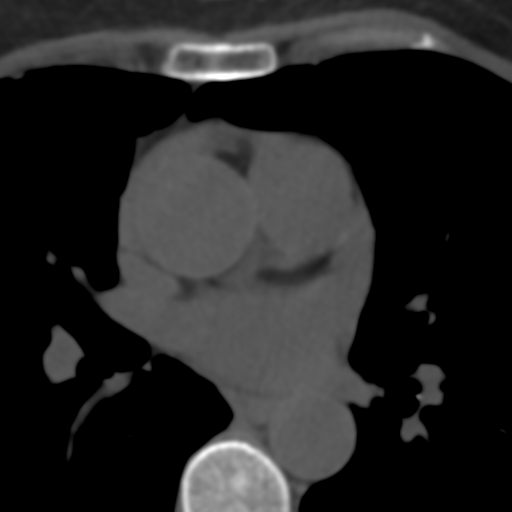

[Series 5: full fov st calcium scoring 3.00 · axial · 0.62mm/px · z∈[-1140,-1050]mm · 5 of 46 slices shown, 7 images]
[im 8/46  vessel]
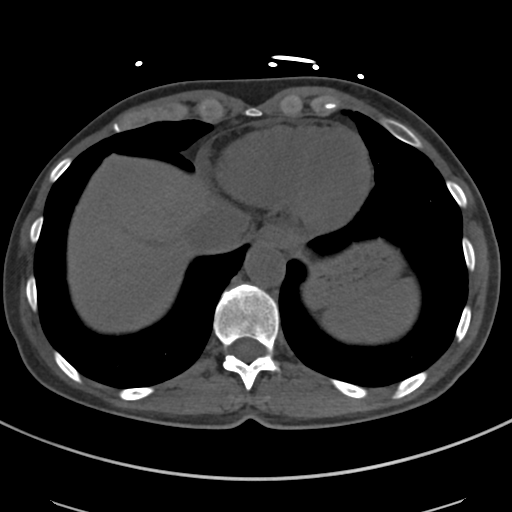
[im 8/46  lung]
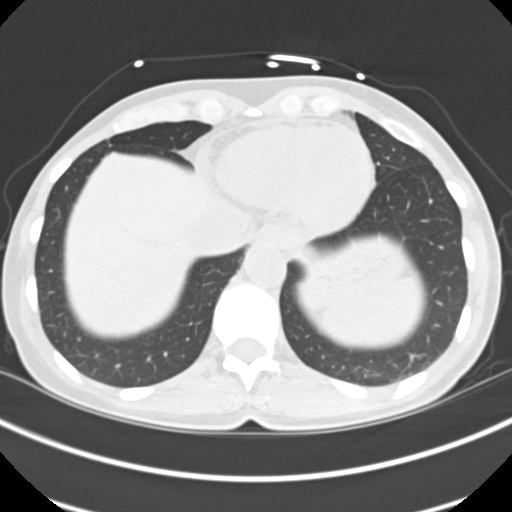
[im 16/46  vessel]
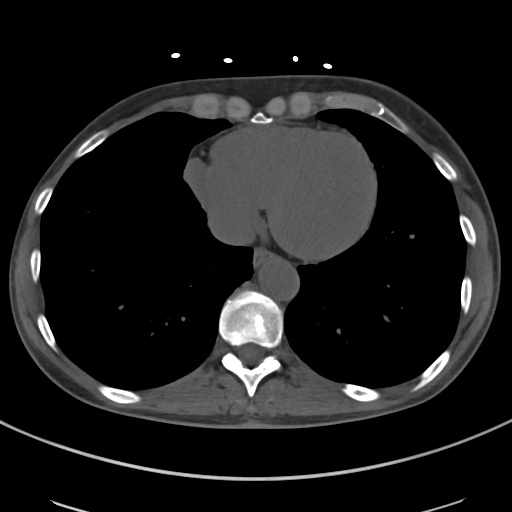
[im 23/46  vessel]
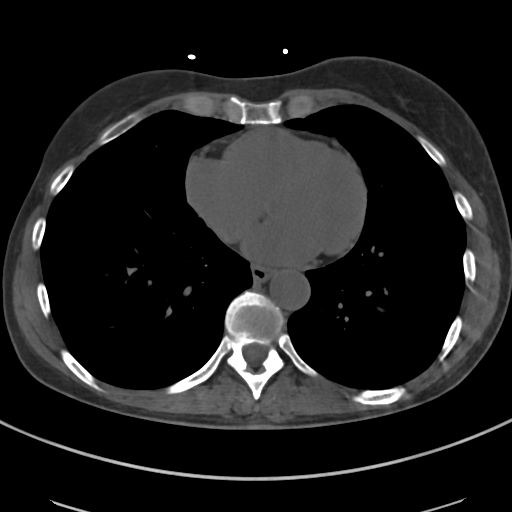
[im 31/46  vessel]
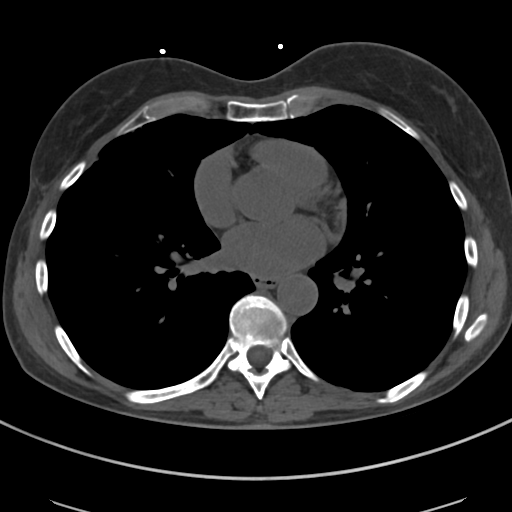
[im 38/46  vessel]
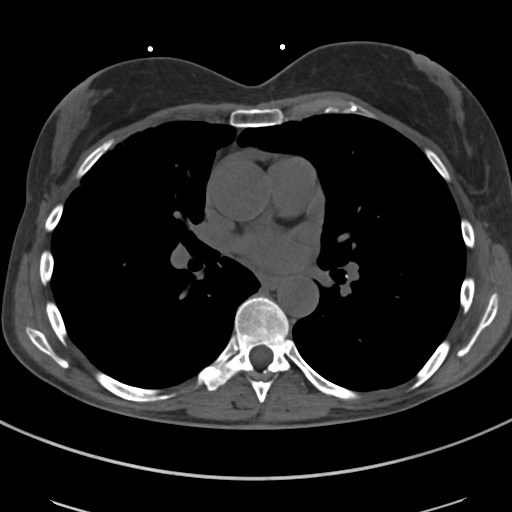
[im 38/46  lung]
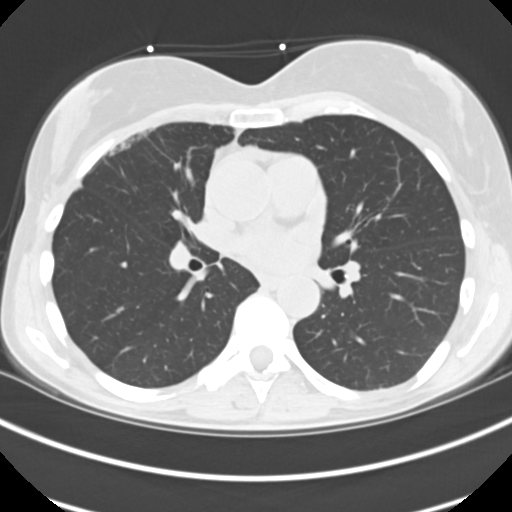

[Series 10: full fov lungs calcium scoring 3.00 ax · axial · 0.62mm/px · z∈[-1137,-1050]mm · 5 of 45 slices shown]
[im 8/45  vessel]
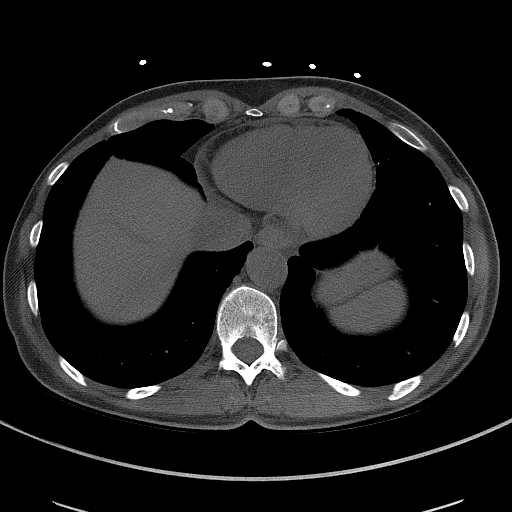
[im 15/45  vessel]
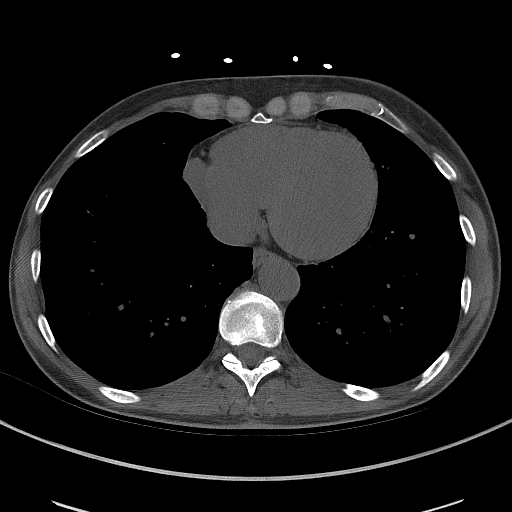
[im 23/45  vessel]
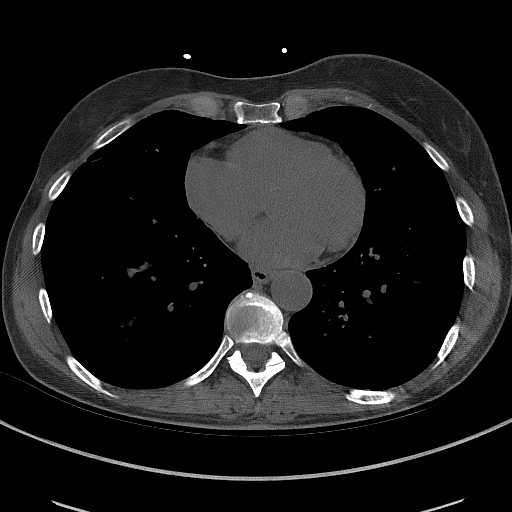
[im 30/45  vessel]
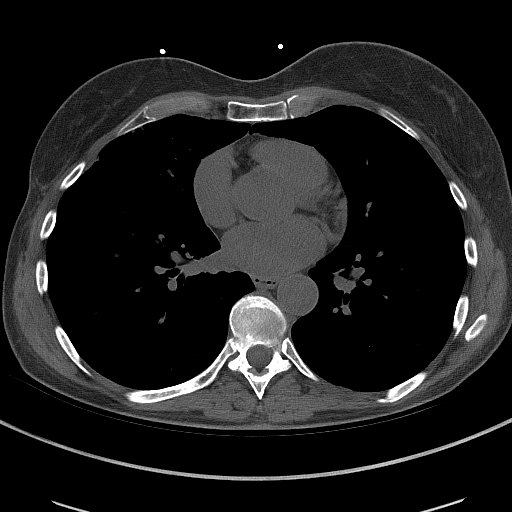
[im 37/45  vessel]
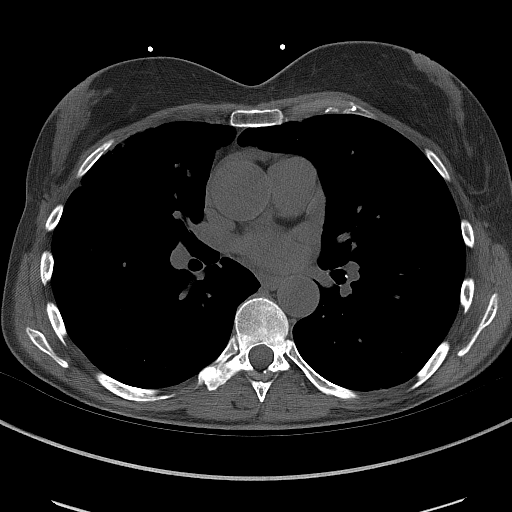

[14 of 20 positions shown; findings below may reference images not displayed]

FINDINGS: Vascular: Top-normal caliber of the ascending thoracic aorta
measuring roughly 3.9 cm in maximum diameter.

Mediastinum/Nodes: Visualized mediastinum and hilar regions
demonstrate no lymphadenopathy or masses.

Lungs/Pleura: Scarring and anterior subpleural reticulation in the
right upper lobe and right middle lobe are suggestive of prior right
breast radiation therapy. No pulmonary nodules, consolidation,
edema, pneumothorax or pleural effusions identified.

Upper Abdomen: No acute abnormality.

Musculoskeletal: No chest wall mass or suspicious bone lesions
identified.
IMPRESSION: 1. Top-normal caliber of the ascending thoracic aorta measuring
approximately 3.9 cm in maximum caliber.
2. Changes in the anterior right subpleural lung likely related to
prior right breast radiation treatment.
FINDINGS: Non-cardiac: See separate report from [REDACTED].

Ascending Aorta: Normal size

Pericardium: Normal

Coronary arteries: Normal origin of left and right coronary
arteries. Distribution of arterial calcifications if present, as
noted below;

LM 0

LAD 0

LCx 0

RCA 0

Total 0

IMPRESSION AND RECOMMENDATION:
1. Normal coronary calcium score of 0. Patient is low risk for
coronary events.

2. CAC 0, [place]0.

3. Continue heart healthy lifestyle and risk factor modification.

Inisa Salamony

*** End of Addendum ***
EXAM:
OVER-READ INTERPRETATION  CT CHEST

The following report is an over-read performed by radiologist Dr.
Mardiwansyah Miharja [REDACTED] on 04/13/2021. This
over-read does not include interpretation of cardiac or coronary
anatomy or pathology. The coronary calcium score interpretation by
the cardiologist is attached.
FINDINGS: Vascular: Top-normal caliber of the ascending thoracic aorta
measuring roughly 3.9 cm in maximum diameter.

Mediastinum/Nodes: Visualized mediastinum and hilar regions
demonstrate no lymphadenopathy or masses.

Lungs/Pleura: Scarring and anterior subpleural reticulation in the
right upper lobe and right middle lobe are suggestive of prior right
breast radiation therapy. No pulmonary nodules, consolidation,
edema, pneumothorax or pleural effusions identified.

Upper Abdomen: No acute abnormality.

Musculoskeletal: No chest wall mass or suspicious bone lesions
identified.
IMPRESSION: 1. Top-normal caliber of the ascending thoracic aorta measuring
approximately 3.9 cm in maximum caliber.
2. Changes in the anterior right subpleural lung likely related to
prior right breast radiation treatment.

## 2022-05-14 ENCOUNTER — Telehealth: Payer: Self-pay | Admitting: Family Medicine

## 2022-05-14 NOTE — Telephone Encounter (Signed)
Thanks

## 2022-05-14 NOTE — Telephone Encounter (Signed)
PPW placed in Dr. Carole Civil inbox for review

## 2022-05-14 NOTE — Telephone Encounter (Signed)
Pt's husband dropped off pt's Living Will forms to have looked over by Dr. Damita Dunnings. Forms are in pcp's folder.

## 2022-05-25 ENCOUNTER — Encounter: Payer: Self-pay | Admitting: Family Medicine

## 2022-05-27 ENCOUNTER — Encounter: Payer: Self-pay | Admitting: Family Medicine

## 2022-05-29 ENCOUNTER — Other Ambulatory Visit: Payer: Self-pay | Admitting: Family Medicine

## 2022-05-29 MED ORDER — PROMETHAZINE HCL 25 MG RE SUPP: 25.0000 mg | Freq: Four times a day (QID) | RECTAL | 1 refills | Status: AC | PRN

## 2022-05-29 NOTE — Progress Notes (Deleted)
p 

## 2022-08-01 ENCOUNTER — Encounter: Payer: Self-pay | Admitting: Family Medicine

## 2022-08-01 ENCOUNTER — Ambulatory Visit (INDEPENDENT_AMBULATORY_CARE_PROVIDER_SITE_OTHER): Payer: BC Managed Care – PPO | Admitting: Family Medicine

## 2022-08-01 VITALS — BP 124/82 | HR 73 | Temp 97.8°F | Ht 70.0 in | Wt 167.0 lb

## 2022-08-01 DIAGNOSIS — G43909 Migraine, unspecified, not intractable, without status migrainosus: Secondary | ICD-10-CM

## 2022-08-01 NOTE — Patient Instructions (Addendum)
Restart magnesium '100mg'$  for a few days and if no diarrhea then increase to '200mg'$ .   Keep taking famotidine and nexium for now and try to limit imitrex.   Taper but caffeine but don't quit cold [place].    When doing well for about 4 weeks, then gradually taper the nexium.  I would cut back by 1 tabs per week.  Then when off nexium, then try taper famotidine but cutting back to '20mg'$ .  Then try taper to every other day or every 3rd day.    Take care.  Glad to see you.

## 2022-08-01 NOTE — Progress Notes (Signed)
H/o gastritis years ago.  Recently restarted famotidine and omeprazole.    H/o migraines with nausea associated with those.    ~5 years ago with sig GI eval.  Took '40mg'$  famotidine and omeprazole prev.  Then was able to wean off meds.    Generally she does well on magnesium.   She had more caffeine recently.  Her mother moved nearby.  Stressors d/w pt.   She had a migraine a few weeks ago. Then had more GI sx a few days after that.  Nausea w/vomiting.  Felt better after vomiting.  This feels similar to prev events a few years ago.  Restarted famotidine and nexium in the meantime and she is clearly better.    She had to use sumatriptan every other day recently.  She is off magnesium recently.  Most recently vomited about 5 days ago.  No black stools, no blood in stool.  Not vomiting blood.   Meds, vitals, and allergies reviewed.  ROS: Per HPI unless specifically indicated in ROS section   GEN: nad, alert and oriented HEENT: ncat NECK: supple w/o LA CV: rrr.  PULM: ctab, no inc wob ABD: soft, +bs EXT: no edema SKIN: well perfused.  CN 2-12 wnl B, S/S wnl x4

## 2022-08-04 NOTE — Assessment & Plan Note (Signed)
Discussed options.  Restart magnesium '100mg'$  for a few days and if no diarrhea then increase to '200mg'$ .   Keep taking famotidine and nexium for now and try to limit imitrex.   Taper but caffeine but don't quit cold [place].    When doing well for about 4 weeks, then gradually taper the nexium.  I would cut back by 1 tab per week.  Then when off nexium, then try taper famotidine but cutting back to '20mg'$ .  Then try taper to every other day or every 3rd day.    She will update me as needed in the meantime.

## 2022-09-23 ENCOUNTER — Encounter: Payer: Self-pay | Admitting: Family Medicine

## 2022-10-14 ENCOUNTER — Encounter: Payer: Self-pay | Admitting: Family Medicine

## 2022-10-24 DIAGNOSIS — X32XXXA Exposure to sunlight, initial encounter: Secondary | ICD-10-CM | POA: Diagnosis not present

## 2022-10-24 DIAGNOSIS — D2261 Melanocytic nevi of right upper limb, including shoulder: Secondary | ICD-10-CM | POA: Diagnosis not present

## 2022-10-24 DIAGNOSIS — L718 Other rosacea: Secondary | ICD-10-CM | POA: Diagnosis not present

## 2022-10-24 DIAGNOSIS — L570 Actinic keratosis: Secondary | ICD-10-CM | POA: Diagnosis not present

## 2022-10-24 DIAGNOSIS — D2262 Melanocytic nevi of left upper limb, including shoulder: Secondary | ICD-10-CM | POA: Diagnosis not present

## 2022-10-24 DIAGNOSIS — D225 Melanocytic nevi of trunk: Secondary | ICD-10-CM | POA: Diagnosis not present

## 2022-11-20 ENCOUNTER — Other Ambulatory Visit: Payer: BC Managed Care – PPO

## 2022-11-29 ENCOUNTER — Encounter: Payer: BC Managed Care – PPO | Admitting: Family Medicine

## 2022-12-01 ENCOUNTER — Other Ambulatory Visit: Payer: Self-pay | Admitting: Family Medicine

## 2022-12-01 DIAGNOSIS — E785 Hyperlipidemia, unspecified: Secondary | ICD-10-CM

## 2022-12-09 ENCOUNTER — Other Ambulatory Visit (INDEPENDENT_AMBULATORY_CARE_PROVIDER_SITE_OTHER): Payer: BC Managed Care – PPO

## 2022-12-09 DIAGNOSIS — E785 Hyperlipidemia, unspecified: Secondary | ICD-10-CM | POA: Diagnosis not present

## 2022-12-09 LAB — COMPREHENSIVE METABOLIC PANEL
ALT: 13 U/L (ref 0–35)
AST: 20 U/L (ref 0–37)
Albumin: 4.3 g/dL (ref 3.5–5.2)
Alkaline Phosphatase: 44 U/L (ref 39–117)
BUN: 14 mg/dL (ref 6–23)
CO2: 30 mEq/L (ref 19–32)
Calcium: 9.3 mg/dL (ref 8.4–10.5)
Chloride: 104 mEq/L (ref 96–112)
Creatinine, Ser: 0.87 mg/dL (ref 0.40–1.20)
GFR: 73.96 mL/min (ref >60.00)
Glucose, Bld: 86 mg/dL (ref 70–99)
Potassium: 4.5 mEq/L (ref 3.5–5.1)
Sodium: 142 mEq/L (ref 135–145)
Total Bilirubin: 0.6 mg/dL (ref 0.2–1.2)
Total Protein: 6.5 g/dL (ref 6.0–8.3)

## 2022-12-09 LAB — CBC WITH DIFFERENTIAL/PLATELET
Basophils Absolute: 0 10*3/uL (ref 0.0–0.1)
Basophils Relative: 0.9 % (ref 0.0–3.0)
Eosinophils Absolute: 0.1 10*3/uL (ref 0.0–0.7)
Eosinophils Relative: 1.8 % (ref 0.0–5.0)
HCT: 39 % (ref 36.0–46.0)
Hemoglobin: 13.1 g/dL (ref 12.0–15.0)
Lymphocytes Relative: 43.5 % (ref 12.0–46.0)
Lymphs Abs: 1.6 10*3/uL (ref 0.7–4.0)
MCHC: 33.5 g/dL (ref 30.0–36.0)
MCV: 89.8 fl (ref 78.0–100.0)
Monocytes Absolute: 0.3 10*3/uL (ref 0.1–1.0)
Monocytes Relative: 7.5 % (ref 3.0–12.0)
Neutro Abs: 1.7 10*3/uL (ref 1.4–7.7)
Neutrophils Relative %: 46.3 % (ref 43.0–77.0)
Platelets: 151 10*3/uL (ref 150.0–400.0)
RBC: 4.35 Mil/uL (ref 3.87–5.11)
RDW: 14 % (ref 11.5–15.5)
WBC: 3.7 10*3/uL — ABNORMAL LOW (ref 4.0–10.5)

## 2022-12-09 LAB — LIPID PANEL
Cholesterol: 192 mg/dL (ref 0–200)
HDL: 82.3 mg/dL (ref >39.00)
LDL Cholesterol: 98 mg/dL (ref 0–99)
NonHDL: 109.41
Total CHOL/HDL Ratio: 2
Triglycerides: 55 mg/dL (ref 0.0–149.0)
VLDL: 11 mg/dL (ref 0.0–40.0)

## 2022-12-16 ENCOUNTER — Encounter: Payer: Self-pay | Admitting: Family Medicine

## 2022-12-16 ENCOUNTER — Ambulatory Visit (INDEPENDENT_AMBULATORY_CARE_PROVIDER_SITE_OTHER): Payer: BC Managed Care – PPO | Admitting: Family Medicine

## 2022-12-16 VITALS — BP 120/70 | HR 69 | Temp 98.0°F | Ht 70.5 in | Wt 165.0 lb

## 2022-12-16 DIAGNOSIS — Z7189 Other specified counseling: Secondary | ICD-10-CM

## 2022-12-16 DIAGNOSIS — Z0000 Encounter for general adult medical examination without abnormal findings: Secondary | ICD-10-CM | POA: Diagnosis not present

## 2022-12-16 DIAGNOSIS — R0683 Snoring: Secondary | ICD-10-CM

## 2022-12-16 DIAGNOSIS — G43909 Migraine, unspecified, not intractable, without status migrainosus: Secondary | ICD-10-CM

## 2022-12-16 MED ORDER — SUMATRIPTAN SUCCINATE 50 MG PO TABS: ORAL_TABLET | ORAL | 5 refills | Status: DC

## 2022-12-16 NOTE — Progress Notes (Unsigned)
CPE- See plan.  Routine anticipatory guidance given to patient.  See health maintenance.  The possibility exists that previously documented standard health maintenance information may have been brought forward from a previous encounter into this note.  If needed, that same information has been updated to reflect the current situation based on today's encounter.    HIV and hepatitis C screening done in the 2000's, negative per patient report. Vaccine history d/w pt.   Living will d/w pt.  Husband then brother Nadine Counts designated if patient were incapacitated.   Pap done 10/2020.   Colonoscopy 2021 Diet and exercise d/w pt.  Doing well with both.  She is walking and doing yoga.  She is getting massages and that helps.    Migraines and hot flashes better with magnesium use.  No ADE on med.  Rare use of imitrex.  She cut back on caffeine.  Her GI sx are better.  She is off PPI and pepcid.    Husband noted her snoring, with occ apnea.  D/w pt about OSA eval.  She isn't waking tired.  No sx with sleeping on side.  Discussed pulm eval.    PMH and SH reviewed  Meds, vitals, and allergies reviewed.   ROS: Per HPI.  Unless specifically indicated otherwise in HPI, the patient denies:  General: fever. Eyes: acute vision changes ENT: sore throat Cardiovascular: chest pain Respiratory: SOB GI: vomiting GU: dysuria Musculoskeletal: acute back pain Derm: acute rash Neuro: acute motor dysfunction Psych: worsening mood Endocrine: polydipsia Heme: bleeding Allergy: hayfever  GEN: nad, alert and oriented HEENT: mucous membranes moist NECK: supple w/o LA CV: rrr. PULM: ctab, no inc wob ABD: soft, +bs EXT: no edema SKIN: no acute rash  The 10-year ASCVD risk score (Arnett DK, et al., 2019) is: 1.5%   Values used to calculate the score:     Age: 57 years     Sex: Female     Is Non-Hispanic African American: No     Diabetic: No     Tobacco smoker: No     Systolic Blood Pressure: 120 mmHg     Is  BP treated: No     HDL Cholesterol: 82.3 mg/dL     Total Cholesterol: 192 mg/dL

## 2022-12-16 NOTE — Patient Instructions (Addendum)
Ask the front for a record release for your colonoscopy report.   Take care.  Glad to see you. Thanks for your effort.   Let me know if you don't get a call about seeing pulmonary.

## 2022-12-18 DIAGNOSIS — R0683 Snoring: Secondary | ICD-10-CM | POA: Insufficient documentation

## 2022-12-18 NOTE — Assessment & Plan Note (Signed)
HIV and hepatitis C screening done in the 2000's, negative per patient report. Vaccine history d/w pt.   Living will d/w pt.  Husband then brother Nadine Counts designated if patient were incapacitated.   Pap done 10/2020.   Colonoscopy 2021 Diet and exercise d/w pt.  Doing well with both.  She is walking and doing yoga.  She is getting massages and that helps.

## 2022-12-18 NOTE — Assessment & Plan Note (Signed)
Refer for sleep apnea evaluation given that her husband had noted her occasionally stop breathing at night.  Rationale discussed with patient.  She agrees with plan.

## 2022-12-18 NOTE — Assessment & Plan Note (Signed)
Living will d/w pt.  Husband then brother Bob designated if patient were incapacitated.   ?

## 2022-12-18 NOTE — Assessment & Plan Note (Signed)
Better in the meantime.  Would continue magnesium.  See above.

## 2022-12-19 DIAGNOSIS — L858 Other specified epidermal thickening: Secondary | ICD-10-CM | POA: Diagnosis not present

## 2022-12-19 DIAGNOSIS — X32XXXA Exposure to sunlight, initial encounter: Secondary | ICD-10-CM | POA: Diagnosis not present

## 2022-12-19 DIAGNOSIS — L570 Actinic keratosis: Secondary | ICD-10-CM | POA: Diagnosis not present

## 2022-12-19 DIAGNOSIS — L821 Other seborrheic keratosis: Secondary | ICD-10-CM | POA: Diagnosis not present

## 2022-12-22 ENCOUNTER — Other Ambulatory Visit: Payer: Self-pay | Admitting: Family Medicine

## 2023-01-22 ENCOUNTER — Encounter: Payer: Self-pay | Admitting: Nurse Practitioner

## 2023-01-22 ENCOUNTER — Ambulatory Visit (INDEPENDENT_AMBULATORY_CARE_PROVIDER_SITE_OTHER): Payer: BC Managed Care – PPO | Admitting: Nurse Practitioner

## 2023-01-22 VITALS — BP 122/80 | HR 65 | Temp 98.0°F | Ht 70.5 in | Wt 168.4 lb

## 2023-01-22 DIAGNOSIS — R0681 Apnea, not elsewhere classified: Secondary | ICD-10-CM

## 2023-01-22 DIAGNOSIS — R0683 Snoring: Secondary | ICD-10-CM

## 2023-01-22 DIAGNOSIS — G4730 Sleep apnea, unspecified: Secondary | ICD-10-CM | POA: Insufficient documentation

## 2023-01-22 NOTE — Patient Instructions (Addendum)
Given your symptoms, I am concerned that you may have sleep disordered breathing with sleep apnea. You will need a sleep study for further evaluation. Someone will contact you to schedule this.   We discussed how untreated sleep apnea puts an individual at risk for cardiac arrhthymias, pulm HTN, DM, stroke and increases their risk for daytime accidents. We also briefly reviewed treatment options including weight loss, side sleeping position, oral appliance, CPAP therapy or referral to ENT for possible surgical options  Use caution when driving and pull over if you become sleepy.  Follow up in 5-6 weeks with Diane Essie Gehret,NP to go over sleep study results, or sooner, if needed

## 2023-01-22 NOTE — Assessment & Plan Note (Signed)
See above

## 2023-01-22 NOTE — Progress Notes (Signed)
Reviewed and agree with assessment/plan.   Coralyn Helling, MD [facility]monary/Critical [facility] 01/22/2023, 10:23 AM Pager:  [phone]

## 2023-01-22 NOTE — Progress Notes (Signed)
@Patient  ID: Diane White, female    DOB: 12-19-1965, 57 y.o.   MRN: [id number]  Chief Complaint  Patient presents with   Consult    Husband says she quits breathing when she sleeps on her back. Snoring when she sleeps on her back.    Referring provider: Joaquim Nam, MD  HPI: 57 year old female, never smoker referred for sleep consult. Past medical history significant for migraines, HLD, right breast cancer 2002.   TEST/EVENTS:   01/22/2023: Today - sleep consult Patient presents today for sleep consult, referred by Dr. Para March. She was having a lot of trouble with her sleep once she hit menopause. Felt like she was resting poorly. She cut out caffeine and started taking magnesium and feels like her sleep is better over the past few months. She wakes feeling rested most mornings. She feels like her energy levels are decent during the day. Her biggest concern is that her husband has noticed that she stops breathing when she lays flat on her back at night. She also snores very loudly. Her brother has sleep apnea as well and has to wear a CPAP. She does get migraines occasionally and they usually come on in the middle of the night. Some sleep talking. No sleep talking/sleep paralysis, drowsy driving. No history of narcolepsy or symptoms of cataplexy. She goes to bed between 10-11 pm. Falls asleep quickly. Wakes 1-2 times a night. Gets up between 6-7 am. No weight gain over the past two years. Never had a previous sleep study. No sleep aids.  No history of cardiac disease, stroke, or DM. She does have a lot of trouble with her sinuses and had a sinus surgery in 2012. She's not sure if this is contributing to her snoring. She is a never smoker. Drinks 2 or less alcoholic beverages a month. She has cut back on caffeine; only drinks 1 cup of coffee in the morning. She lives with her husband. She is retired. Family history of allergies, heart disease, rheumatism, cancer.   Epworth 1    No Known  Allergies  Immunization History  Administered Date(s) Administered   Hepatitis A 05/26/1997, 06/26/2004   Hepatitis B 09/10/2002, 12/30/2002, 09/27/2003   Influenza,inj,Quad PF,6+ Mos 06/05/2021, 05/24/2022   Moderna Sars-Covid-2 Vaccination 09/10/2019, 10/08/2019, 07/01/2020, 01/11/2021   PFIZER Comirnaty(Gray Top)Covid-19 Tri-Sucrose Vaccine 05/22/2022   Pfizer Covid-19 Vaccine Bivalent Booster 31yrs & up 05/18/2021   Tdap 08/27/2015   Zoster Recombinat (Shingrix) 04/14/2017, 05/19/2017    Past Medical History:  Diagnosis Date   Chicken pox    History of breast cancer    Lumpectomy and radiation at age 56.   History of skin cancer    Status post Mohs surgery, right hand.   Inflammatory polyps of colon with unspecified complications (HCC)    Migraines     Tobacco History: Social History   Tobacco Use  Smoking Status Never  Smokeless Tobacco Never   Counseling given: Not Answered   Outpatient Medications Prior to Visit  Medication Sig Dispense Refill   Calcium-Vitamin D-Vitamin K (CALCIUM SOFT CHEWS) [phone] MG-UNT-MCG CHEW Chew 1 tablet by mouth daily.     Cholecalciferol 50 MCG (2000 UT) CAPS Take by mouth.     Magnesium 100 MG TABS Take 2 tablets (200 mg total) by mouth daily.     promethazine (PROMETHEGAN) 25 MG suppository Place 1 suppository (25 mg total) rectally every 6 (six) hours as needed for nausea or vomiting. 12 each 1   SUMAtriptan (IMITREX)  50 MG tablet Take as needed for migraine.  Can repeat with second dose in 2 hours if needed.  Max 2 doses in 24 hours. 10 tablet 5   No facility-administered medications prior to visit.     Review of Systems:   Constitutional: No weight loss or gain, night sweats, fevers, chills, fatigue, or lassitude. HEENT: No difficulty swallowing, tooth/dental problems, or sore throat. No sneezing, itching, ear ache, nasal congestion, or post nasal drip. +migraines  CV:  No chest pain, orthopnea, PND, swelling in lower  extremities, anasarca, dizziness, palpitations, syncope Resp: +snoring, witnessed apneas. No shortness of breath with exertion or at rest. No excess mucus or change in color of mucus. No productive or non-productive. No hemoptysis. No wheezing.  No chest wall deformity GI:  No heartburn, indigestion GU: No dysuria, change in color of urine, urgency or frequency, nocturia.   Skin: No rash, lesions, ulcerations MSK:  No joint pain or swelling.   Neuro: No dizziness or lightheadedness.  Psych: No depression or anxiety. Mood stable. +sleep disturbance    Physical Exam:  BP 122/80 (BP Location: Left Arm, Cuff Size: Normal)   Pulse 65   Temp 98 F (36.7 C)   Ht 5' 10.5" (1.791 m)   Wt 168 lb 6.4 oz (76.4 kg)   SpO2 100%   BMI 23.82 kg/m   GEN: Pleasant, interactive, well-appearing; in no acute distress. HEENT:  Normocephalic and atraumatic. PERRLA. Sclera white. Nasal turbinates pink, moist and patent bilaterally. No rhinorrhea present. Oropharynx pink and moist, without exudate or edema. No lesions, ulcerations, or postnasal drip. Mallampati III/IV NECK:  Supple w/ fair ROM. No JVD present. Normal carotid impulses w/o bruits. Thyroid symmetrical with no goiter or nodules palpated. No lymphadenopathy.   CV: RRR, no m/r/g, no peripheral edema. Pulses intact, +2 bilaterally. No cyanosis, pallor or clubbing. PULMONARY:  Unlabored, regular breathing. Clear bilaterally A&P w/o wheezes/rales/rhonchi. No accessory muscle use.  GI: BS present and normoactive. Soft, non-tender to palpation. No organomegaly or masses detected.  MSK: No erythema, warmth or tenderness. Cap refil <2 sec all extrem.  Neuro: A/Ox3. No focal deficits noted.   Skin: Warm, no lesions or rashe Psych: Normal affect and behavior. Judgement and thought content appropriate.     Lab Results:  CBC    Component Value Date/Time   WBC 3.7 (L) 12/09/2022 0809   RBC 4.35 12/09/2022 0809   HGB 13.1 12/09/2022 0809   HCT 39.0  12/09/2022 0809   PLT 151.0 12/09/2022 0809   MCV 89.8 12/09/2022 0809   MCHC 33.5 12/09/2022 0809   RDW 14.0 12/09/2022 0809   LYMPHSABS 1.6 12/09/2022 0809   MONOABS 0.3 12/09/2022 0809   EOSABS 0.1 12/09/2022 0809   BASOSABS 0.0 12/09/2022 0809    BMET    Component Value Date/Time   NA 142 12/09/2022 0809   K 4.5 12/09/2022 0809   CL 104 12/09/2022 0809   CO2 30 12/09/2022 0809   GLUCOSE 86 12/09/2022 0809   BUN 14 12/09/2022 0809   CREATININE 0.87 12/09/2022 0809   CALCIUM 9.3 12/09/2022 0809    BNP No results found for: "BNP"   Imaging:  No results found.        No data to display          No results found for: "NITRICOXIDE"      Assessment & Plan:   Loud snoring She has snoring, nocturnal apneic events, chronic migraines, restless sleep. Given this,  I am concerned she  could have sleep disordered breathing with obstructive sleep apnea. She will need sleep study for further evaluation.    - discussed how weight can impact sleep and risk for sleep disordered breathing - discussed options to assist with weight loss: combination of diet modification, cardiovascular and strength training exercises   - had an extensive discussion regarding the adverse health consequences related to untreated sleep disordered breathing - specifically discussed the risks for hypertension, coronary artery disease, cardiac dysrhythmias, cerebrovascular disease, and diabetes - lifestyle modification discussed   - discussed how sleep disruption can increase risk of accidents, particularly when driving - safe driving practices were discussed  Patient Instructions  Given your symptoms, I am concerned that you may have sleep disordered breathing with sleep apnea. You will need a sleep study for further evaluation. Someone will contact you to schedule this.   We discussed how untreated sleep apnea puts an individual at risk for cardiac arrhthymias, pulm HTN, DM, stroke and  increases their risk for daytime accidents. We also briefly reviewed treatment options including weight loss, side sleeping position, oral appliance, CPAP therapy or referral to ENT for possible surgical options  Use caution when driving and pull over if you become sleepy.  Follow up in 5-6 weeks with Florentina Addison Logyn Kendrick,NP to go over sleep study results, or sooner, if needed     Witnessed apneic spells See above   I spent 35 minutes of dedicated to the care of this patient on the date of this encounter to include pre-visit review of records, face-to-face time with the patient discussing conditions above, post visit ordering of testing, clinical documentation with the electronic health record, making appropriate referrals as documented, and communicating necessary findings to members of the patients care team.  Noemi Chapel, NP 01/22/2023  Pt aware and understands NP's role.

## 2023-01-22 NOTE — Assessment & Plan Note (Signed)
She has snoring, nocturnal apneic events, chronic migraines, restless sleep. Given this,  I am concerned she could have sleep disordered breathing with obstructive sleep apnea. She will need sleep study for further evaluation.    - discussed how weight can impact sleep and risk for sleep disordered breathing - discussed options to assist with weight loss: combination of diet modification, cardiovascular and strength training exercises   - had an extensive discussion regarding the adverse health consequences related to untreated sleep disordered breathing - specifically discussed the risks for hypertension, coronary artery disease, cardiac dysrhythmias, cerebrovascular disease, and diabetes - lifestyle modification discussed   - discussed how sleep disruption can increase risk of accidents, particularly when driving - safe driving practices were discussed  Patient Instructions  Given your symptoms, I am concerned that you may have sleep disordered breathing with sleep apnea. You will need a sleep study for further evaluation. Someone will contact you to schedule this.   We discussed how untreated sleep apnea puts an individual at risk for cardiac arrhthymias, pulm HTN, DM, stroke and increases their risk for daytime accidents. We also briefly reviewed treatment options including weight loss, side sleeping position, oral appliance, CPAP therapy or referral to ENT for possible surgical options  Use caution when driving and pull over if you become sleepy.  Follow up in 5-6 weeks with Katie Jazmine Longshore,NP to go over sleep study results, or sooner, if needed

## 2023-01-27 ENCOUNTER — Telehealth: Payer: Self-pay | Admitting: Family Medicine

## 2023-01-27 NOTE — Telephone Encounter (Signed)
Will see tomorrow AM.   Does she have a fever?  Is her neck immobile?    Please let me know.  Thanks.

## 2023-01-27 NOTE — Telephone Encounter (Signed)
I spoke with pt; no fever today; pt last took ibuprofen for H/A on 01/26/23. Pt is feeling tired but pt has been working really hard. Pt does not have stiff neck today. Last time neck hurt and stiff on 01/25/23.pt does not have stiffness or pain in neck today. UC & ED precautions given and pt voiced understanding. Sending note to Dr Para March.

## 2023-01-27 NOTE — Telephone Encounter (Signed)
Pt called stating on last Fri, 5/31, she has a tick bite & since the bite she's had a stiff neck along with headaches. Scheduled pt with Para March tomorrow, 6/4 @ 8:00 AM. Transferred pt to access nurse. Call back # [phone]

## 2023-01-27 NOTE — Telephone Encounter (Signed)
Per appt notes pt already has appt with Dr Para March on 01/28/23 at 8 AQM. Sending note to Dr Para March and Para March pool.

## 2023-01-28 ENCOUNTER — Ambulatory Visit (INDEPENDENT_AMBULATORY_CARE_PROVIDER_SITE_OTHER): Payer: BC Managed Care – PPO | Admitting: Family Medicine

## 2023-01-28 ENCOUNTER — Encounter: Payer: Self-pay | Admitting: Family Medicine

## 2023-01-28 VITALS — BP 110/80 | HR 73 | Temp 97.2°F | Ht 70.0 in | Wt 166.0 lb

## 2023-01-28 DIAGNOSIS — M791 Myalgia, unspecified site: Secondary | ICD-10-CM

## 2023-01-28 MED ORDER — DOXYCYCLINE HYCLATE 100 MG PO TABS
100.0000 mg | ORAL_TABLET | Freq: Two times a day (BID) | ORAL | 0 refills | Status: DC
Start: 2023-01-28 — End: 2023-03-17

## 2023-01-28 NOTE — Progress Notes (Unsigned)
Recent tick bite, removed.  Local skin irritation.  No fevers.  Neck was sore.  HA today but not photophobia.  Didn't fell well in general.  She has h/o neck and shoulder soreness occ at baseline but more recently, since the tick bite.  She took a covid test, neg.  Fatigued.  Neck ROM is still wnl. No other rash. Small punctate blanching area on the R upper thigh.     Meds, vitals, and allergies reviewed.   ROS: Per HPI unless specifically indicated in ROS section   Nad Ncat Neck supple, no LA CN 2-12 wnl B, S/S wnl x4 Rrr Ctab Minimal local skin irritation on the right thigh previous tick bite site without bull's-eye rash or spreading erythema.  No fluctuant mass.

## 2023-01-28 NOTE — Telephone Encounter (Signed)
Noted. Thanks.

## 2023-01-28 NOTE — Patient Instructions (Signed)
Start doxy, sunburn caution.  Update me as needed.  Go to the lab on the way out.   If you have mychart we'll likely use that to update you.    Take care.  Glad to see you.

## 2023-01-30 DIAGNOSIS — M791 Myalgia, unspecified site: Secondary | ICD-10-CM | POA: Insufficient documentation

## 2023-01-30 NOTE — Assessment & Plan Note (Signed)
With recent tick bite noted.  Discussed options.  Discussed limitations of lab testing at this point. Start doxy, sunburn caution.  Update me as needed.  See notes on labs.  Okay for outpatient follow-up.  Routine cautions given to patient.  She does not have a stiff neck or signs of meningitis.

## 2023-02-02 LAB — ROCKY MTN SPOTTED FVR ABS PNL(IGG+IGM)
RMSF IgG: NOT DETECTED
RMSF IgM: NOT DETECTED

## 2023-02-02 LAB — B. BURGDORFI ANTIBODIES: B burgdorferi Ab IgG+IgM: <0.9 index

## 2023-02-04 DIAGNOSIS — Z01419 Encounter for gynecological examination (general) (routine) without abnormal findings: Secondary | ICD-10-CM | POA: Diagnosis not present

## 2023-02-04 DIAGNOSIS — Z1331 Encounter for screening for depression: Secondary | ICD-10-CM | POA: Diagnosis not present

## 2023-02-17 DIAGNOSIS — G4730 Sleep apnea, unspecified: Secondary | ICD-10-CM | POA: Diagnosis not present

## 2023-02-18 ENCOUNTER — Telehealth: Payer: Self-pay | Admitting: Pulmonary Disease

## 2023-02-18 ENCOUNTER — Ambulatory Visit (INDEPENDENT_AMBULATORY_CARE_PROVIDER_SITE_OTHER): Payer: BC Managed Care – PPO | Admitting: Nurse Practitioner

## 2023-02-18 DIAGNOSIS — G4733 Obstructive sleep apnea (adult) (pediatric): Secondary | ICD-10-CM

## 2023-02-18 DIAGNOSIS — R0681 Apnea, not elsewhere classified: Secondary | ICD-10-CM

## 2023-02-18 DIAGNOSIS — R0683 Snoring: Secondary | ICD-10-CM

## 2023-02-18 NOTE — Telephone Encounter (Signed)
Spoke with patient regarding sleep study result's   Sleep study result   Date of study: 02/17/2023   Impression: Moderate obstructive sleep apnea with moderate oxygen desaturations   Recommendation: DME referral   Recommend CPAP therapy for moderate obstructive sleep apnea   Auto titrating CPAP with pressure settings of 5-15 will be appropriate   Encourage weight loss measures   Follow-up in the office 4 to 6 weeks following initiation of treatment  Patient does have a f/u with Florentina Addison 03/04/23. Patient was advised to keep office visit.   Patient's voice was understanding. Nothing else further needed.

## 2023-02-18 NOTE — Telephone Encounter (Signed)
Ret our call about HST results. Please try again @ [phone]

## 2023-02-18 NOTE — Telephone Encounter (Signed)
ATC x1 LVM for patient to cal our office back regarding sleep study result's.

## 2023-02-18 NOTE — Telephone Encounter (Signed)
Call patient  Sleep study result  Date of study: 02/17/2023  Impression: Moderate obstructive sleep apnea with moderate oxygen desaturations  Recommendation: DME referral  Recommend CPAP therapy for moderate obstructive sleep apnea  Auto titrating CPAP with pressure settings of 5-15 will be appropriate  Encourage weight loss measures  Follow-up in the office 4 to 6 weeks following initiation of treatment

## 2023-02-19 NOTE — Telephone Encounter (Signed)
Sleep Study was scanned into Epic yesterday

## 2023-02-19 NOTE — Progress Notes (Signed)
Pt says she completed three nights of study. We only received one night from what I can tell. She is requesting the data from the additional two nights. Can we follow up with the PCCs on this? Thanks!

## 2023-02-20 NOTE — Telephone Encounter (Signed)
[facility] did call pt with hst results. Nothing further needed

## 2023-02-24 ENCOUNTER — Telehealth: Payer: Self-pay

## 2023-02-24 NOTE — Telephone Encounter (Signed)
We only get what has been scanned into patient's chart.  Pt can reach out to [facility] themselves or you can ask the provider who read the study about this. . [facility]'s # is [id number].  The PCCs do not have control over what we received.

## 2023-02-24 NOTE — Telephone Encounter (Signed)
The PCCs have no control over what has been read by the provider.  Please check with Dr. Wynona Neat or [facility].

## 2023-02-24 NOTE — Telephone Encounter (Signed)
Diane White, Thanks for the update.  I called [facility].  The woman I spoke with said that they usually send all nights of data to the doctor so I'm wondering if it either was missed when the results were reviewed or perhaps there was an error on [facility]'s end and they didn't send all of the data.  They will resend all of the data to the doctor.   I mentioned that I have an appointment next week and the woman I spoke with said she would try to have the results sent this week.   If you could let me know when the results have been reviewed, that would be great.   Perhaps we should pick a time this week where if I don't hear from someone, I'll reschedule my appointment... Maybe sometime on Wednesday since Thursday is a holiday I wasn't sure if the office would be fully staffed).  Diane White   OA, please asdvise. Thanks

## 2023-02-24 NOTE — Telephone Encounter (Signed)
AO, please advise. Thanks.  

## 2023-02-24 NOTE — Telephone Encounter (Signed)
The only data that was available to me to read on snap was the study that was uploaded for 02/17/2023 which did reveal moderate obstructive sleep apnea.  Everyone's  sleep varies from night to night, yes, sleep position may affect severity of sleep disordered breathing.

## 2023-02-24 NOTE — Telephone Encounter (Signed)
Pt says she completed three nights of study. We only received one night from what I can tell. She is requesting the data from the additional two nights. Can we follow up with the PCCs on this? Thanks!    [facility] please advise

## 2023-02-25 NOTE — Telephone Encounter (Signed)
Please see 02/18/2023 mychart message.

## 2023-02-25 NOTE — Telephone Encounter (Signed)
I did send a message about this.  The only study that was loaded on snap was the night of 6/24 which I read.

## 2023-02-26 DIAGNOSIS — F4323 Adjustment disorder with mixed anxiety and depressed mood: Secondary | ICD-10-CM | POA: Diagnosis not present

## 2023-03-04 ENCOUNTER — Ambulatory Visit: Payer: BC Managed Care – PPO | Admitting: Nurse Practitioner

## 2023-03-17 ENCOUNTER — Telehealth: Payer: Self-pay | Admitting: Family Medicine

## 2023-03-17 ENCOUNTER — Ambulatory Visit (INDEPENDENT_AMBULATORY_CARE_PROVIDER_SITE_OTHER): Payer: BC Managed Care – PPO | Admitting: Family Medicine

## 2023-03-17 VITALS — BP 118/80 | HR 68 | Temp 98.4°F | Ht 70.0 in | Wt 168.0 lb

## 2023-03-17 DIAGNOSIS — G4733 Obstructive sleep apnea (adult) (pediatric): Secondary | ICD-10-CM | POA: Insufficient documentation

## 2023-03-17 DIAGNOSIS — G4739 Other sleep apnea: Secondary | ICD-10-CM | POA: Insufficient documentation

## 2023-03-17 MED ORDER — NIRMATRELVIR/RITONAVIR (PAXLOVID)TABLET: 3.0000 | ORAL_TABLET | Freq: Two times a day (BID) | ORAL | 0 refills | Status: AC

## 2023-03-17 NOTE — Assessment & Plan Note (Signed)
See above.  Asking for pulmonary input re: follow up with Dr. Wynona Neat and DME supplies/orders.  I think it makes sense to get her APAP set up to start tx and get some data collected with pulmonary f/u after that.  She agrees with plan.    30 minutes were devoted to patient care in this encounter (this includes time spent reviewing the patient's file/history, interviewing and examining the patient, counseling/reviewing plan with patient).

## 2023-03-17 NOTE — Telephone Encounter (Signed)
Please call pt.  Travelling to [place]- rx sent for paxlovid if covid positive on the trip.  I don't know if insurance will cover this rx.  Could see about getting it filled and hold it in the meantime.    Her rx is dosed for her kidney function- 3 tabs twice a day.  This is different from spouse's rx.   Thanks.

## 2023-03-17 NOTE — Telephone Encounter (Addendum)
Dr. Wynona Neat- Saw this patient in clinic today.  She isn't set up on APAP yet.  She needs to know who to contact about getting her APAP set up.  I would appreciate your staff contacting her.    Can this patient's follow up get moved to your schedule?  She would like your input.  I presume that follow up would need to happen after she was set up on APAP.    Many thanks.

## 2023-03-17 NOTE — Patient Instructions (Addendum)
I would give APAP a try.  Pulmonary should call you in the meantime.  Take care.  Glad to see you.

## 2023-03-17 NOTE — Progress Notes (Signed)
D/w pt about OSA report, moderate OSA.  Discussed migraines, as OSA could contribute. D/w pt about central vs obstructive sleep apnea.  Discussed rationale for APAP use.  She was asking about getting follow up with Dr. Wynona Neat- note sent.  Discussed getting her supplies set up through pulmonary and DME.  I asked for advice on that through pulmonary.    She is going on a river cruise in [place], see following phone note.    Meds, vitals, and allergies reviewed.   ROS: Per HPI unless specifically indicated in ROS section   Nad Ncat Neck supple Rrr Ctab Skin well perfused.  No edema.

## 2023-03-17 NOTE — Telephone Encounter (Signed)
Patient notified rx was sent and advised that we aren't sure about insurance coverage. She thanked [place] for this.

## 2023-03-18 DIAGNOSIS — Z1231 Encounter for screening mammogram for malignant neoplasm of breast: Secondary | ICD-10-CM | POA: Diagnosis not present

## 2023-03-18 LAB — HM MAMMOGRAPHY

## 2023-03-19 ENCOUNTER — Encounter: Payer: Self-pay | Admitting: Family Medicine

## 2023-03-20 ENCOUNTER — Encounter: Payer: Self-pay | Admitting: Family Medicine

## 2023-03-26 ENCOUNTER — Ambulatory Visit (INDEPENDENT_AMBULATORY_CARE_PROVIDER_SITE_OTHER): Payer: BC Managed Care – PPO | Admitting: Nurse Practitioner

## 2023-03-26 ENCOUNTER — Encounter: Payer: Self-pay | Admitting: Nurse Practitioner

## 2023-03-26 VITALS — BP 130/62 | HR 69 | Temp 97.8°F | Ht 71.0 in | Wt 169.0 lb

## 2023-03-26 DIAGNOSIS — G43909 Migraine, unspecified, not intractable, without status migrainosus: Secondary | ICD-10-CM | POA: Diagnosis not present

## 2023-03-26 DIAGNOSIS — G4739 Other sleep apnea: Secondary | ICD-10-CM

## 2023-03-26 NOTE — Assessment & Plan Note (Signed)
Mixed sleep apnea with total AHI 15.7/h, CAI 10.6/h. Given the number of central events, advised in lab CPAP titration study to ensure she is well controlled on CPAP and rule out emergent central events. We did discuss that she may need BiLevel machine dependent on control during titration. Educated on proper use/care of device. Lengthy discussion surrounding different types of sleep apnea and treatment options. Reviewed risks of untreated moderate sleep apnea. Cautioned on safe driving practices.   Patient Instructions  Your sleep study showed moderate sleep apnea with primarily central sleep apneic events. We will have you complete an in lab study to ensure you are well controlled with CPAP and if not, transition you to BiPAP. I will call you afterwards to discuss results and send orders for appropriate PAP therapy.  We discussed how untreated sleep apnea puts an individual at risk for cardiac arrhthymias, pulm HTN, DM, stroke and increases their risk for daytime accidents.   Follow up after starting PAP therapy with Dr. Wynona Neat or Philis Nettle, or sooner, if needed

## 2023-03-26 NOTE — Progress Notes (Signed)
@Patient  ID: Diane White, female    DOB: 07-07-1966, 57 y.o.   MRN: [id number]  Chief Complaint  Patient presents with   Follow-up    HST    Referring provider: Joaquim Nam, MD  HPI: 57 year old female, never smoker referred for sleep consult. Past medical history significant for migraines, HLD, right breast cancer 2002.   TEST/EVENTS:  02/17/2023 HST: AHI 15.7/h, CAI 10.6/h (68%), SpO2 low 74%  01/22/2023: Ov with Airam Runions NP for sleep consult, referred by Dr. Para March. She was having a lot of trouble with her sleep once she hit menopause. Felt like she was resting poorly. She cut out caffeine and started taking magnesium and feels like her sleep is better over the past few months. She wakes feeling rested most mornings. She feels like her energy levels are decent during the day. Her biggest concern is that her husband has noticed that she stops breathing when she lays flat on her back at night. She also snores very loudly. Her brother has sleep apnea as well and has to wear a CPAP. She does get migraines occasionally and they usually come on in the middle of the night. Some sleep talking. No sleep talking/sleep paralysis, drowsy driving. No history of narcolepsy or symptoms of cataplexy. She goes to bed between 10-11 pm. Falls asleep quickly. Wakes 1-2 times a night. Gets up between 6-7 am. No weight gain over the past two years. Never had a previous sleep study. No sleep aids.  No history of cardiac disease, stroke, or DM. She does have a lot of trouble with her sinuses and had a sinus surgery in 2012. She's not sure if this is contributing to her snoring. She is a never smoker. Drinks 2 or less alcoholic beverages a month. She has cut back on caffeine; only drinks 1 cup of coffee in the morning. She lives with her husband. She is retired. Family history of allergies, heart disease, rheumatism, cancer.  [place] 1   03/26/2023: Today - follow up Patient presents today for follow up to  discuss home sleep study results with her husband. Her sleep study showed moderate sleep apnea with 68% being central/mixed events. She has questions about her study and next steps. She feels unchanged compared to her last visit. Her husband agrees that she does stop breathing at night when she sleeps. She denies sleep parasomnias, drowsy driving.   No Known Allergies  Immunization History  Administered Date(s) Administered   Hepatitis A 05/26/1997, 06/26/2004   Hepatitis B 09/10/2002, 12/30/2002, 09/27/2003   Influenza,inj,Quad PF,6+ Mos 06/05/2021, 05/24/2022   Moderna Sars-Covid-2 Vaccination 09/10/2019, 10/08/2019, 07/01/2020, 01/11/2021   PFIZER Comirnaty(Gray Top)Covid-19 Tri-Sucrose Vaccine 05/22/2022   Pfizer Covid-19 Vaccine Bivalent Booster 46yrs & up 05/18/2021   Tdap 08/27/2015   Zoster Recombinant(Shingrix) 04/14/2017, 05/19/2017    Past Medical History:  Diagnosis Date   Chicken pox    History of breast cancer    Lumpectomy and radiation at age 14.   History of skin cancer    Status post Mohs surgery, right hand.   Inflammatory polyps of colon with unspecified complications (HCC)    Migraines     Tobacco History: Social History   Tobacco Use  Smoking Status Never  Smokeless Tobacco Never   Counseling given: Not Answered   Outpatient Medications Prior to Visit  Medication Sig Dispense Refill   Calcium-Vitamin D-Vitamin K (CALCIUM SOFT CHEWS) [id number] MG-UNT-MCG CHEW Chew 1 tablet by mouth daily.     Cholecalciferol 50  MCG (2000 UT) CAPS Take by mouth.     Magnesium 100 MG TABS Take 2 tablets (200 mg total) by mouth daily.     promethazine (PROMETHEGAN) 25 MG suppository Place 1 suppository (25 mg total) rectally every 6 (six) hours as needed for nausea or vomiting. 12 each 1   SUMAtriptan (IMITREX) 50 MG tablet Take as needed for migraine.  Can repeat with second dose in 2 hours if needed.  Max 2 doses in 24 hours. 10 tablet 5   No facility-administered  medications prior to visit.     Review of Systems:   Constitutional: No weight loss or gain, night sweats, fevers, chills, fatigue, or lassitude. HEENT: No difficulty swallowing, tooth/dental problems, or sore throat. No sneezing, itching, ear ache, nasal congestion, or post nasal drip. +migraines  CV:  No chest pain, orthopnea, PND, swelling in lower extremities, anasarca, dizziness, palpitations, syncope Resp: +snoring, witnessed apneas. No shortness of breath with exertion or at rest. No excess mucus or change in color of mucus. No productive or non-productive. No hemoptysis. No wheezing.  No chest wall deformity GI:  No heartburn, indigestion GU: No dysuria, change in color of urine, urgency or frequency, nocturia.   Skin: No rash, lesions, ulcerations MSK:  No joint pain or swelling.   Neuro: No dizziness or lightheadedness.  Psych: No depression or anxiety. Mood stable. +sleep disturbance    Physical Exam:  BP 130/62 (BP Location: Left Arm, Cuff Size: Normal)   Pulse 69   Temp 97.8 F (36.6 C) (Temporal)   Ht 5\' 11"  (1.803 m)   Wt 169 lb (76.7 kg)   SpO2 99%   BMI 23.57 kg/m   GEN: Pleasant, interactive, well-appearing; in no acute distress. HEENT:  Normocephalic and atraumatic. PERRLA. Sclera white. Nasal turbinates pink, moist and patent bilaterally. No rhinorrhea present. Oropharynx pink and moist, without exudate or edema. No lesions, ulcerations, or postnasal drip. Mallampati III/IV NECK:  Supple w/ fair ROM. No JVD present. Normal carotid impulses w/o bruits. Thyroid symmetrical with no goiter or nodules palpated. No lymphadenopathy.   CV: RRR, no m/r/g, no peripheral edema. Pulses intact, +2 bilaterally. No cyanosis, pallor or clubbing. PULMONARY:  Unlabored, regular breathing. Clear bilaterally A&P w/o wheezes/rales/rhonchi. No accessory muscle use.  GI: BS present and normoactive. Soft, non-tender to palpation. No organomegaly or masses detected.  MSK: No erythema,  warmth or tenderness. Cap refil <2 sec all extrem.  Neuro: A/Ox3. No focal deficits noted.   Skin: Warm, no lesions or rashe Psych: Normal affect and behavior. Judgement and thought content appropriate.     Lab Results:  CBC    Component Value Date/Time   WBC 3.7 (L) 12/09/2022 0809   RBC 4.35 12/09/2022 0809   HGB 13.1 12/09/2022 0809   HCT 39.0 12/09/2022 0809   PLT 151.0 12/09/2022 0809   MCV 89.8 12/09/2022 0809   MCHC 33.5 12/09/2022 0809   RDW 14.0 12/09/2022 0809   LYMPHSABS 1.6 12/09/2022 0809   MONOABS 0.3 12/09/2022 0809   EOSABS 0.1 12/09/2022 0809   BASOSABS 0.0 12/09/2022 0809    BMET    Component Value Date/Time   NA 142 12/09/2022 0809   K 4.5 12/09/2022 0809   CL 104 12/09/2022 0809   CO2 30 12/09/2022 0809   GLUCOSE 86 12/09/2022 0809   BUN 14 12/09/2022 0809   CREATININE 0.87 12/09/2022 0809   CALCIUM 9.3 12/09/2022 0809    BNP No results found for: "BNP"   Imaging:  No  results found.  Administration History     None           No data to display          No results found for: "NITRICOXIDE"      Assessment & Plan:   Mixed sleep apnea Mixed sleep apnea with total AHI 15.7/h, CAI 10.6/h. Given the number of central events, advised in lab CPAP titration study to ensure she is well controlled on CPAP and rule out emergent central events. We did discuss that she may need BiLevel machine dependent on control during titration. Educated on proper use/care of device. Lengthy discussion surrounding different types of sleep apnea and treatment options. Reviewed risks of untreated moderate sleep apnea. Cautioned on safe driving practices.   Patient Instructions  Your sleep study showed moderate sleep apnea with primarily central sleep apneic events. We will have you complete an in lab study to ensure you are well controlled with CPAP and if not, transition you to BiPAP. I will call you afterwards to discuss results and send orders for  appropriate PAP therapy.  We discussed how untreated sleep apnea puts an individual at risk for cardiac arrhthymias, pulm HTN, DM, stroke and increases their risk for daytime accidents.   Follow up after starting PAP therapy with Dr. Wynona Neat or Florentina Addison Armani Brar,NP, or sooner, if needed    Migraines Possibly due to untreated sleep apnea. Follow up with neurology as scheduled.     I spent 41 minutes of dedicated to the care of this patient on the date of this encounter to include pre-visit review of records, face-to-face time with the patient discussing conditions above, post visit ordering of testing, clinical documentation with the electronic health record, making appropriate referrals as documented, and communicating necessary findings to members of the patients care team.  Noemi Chapel, NP 03/26/2023  Pt aware and understands NP's role.

## 2023-03-26 NOTE — Patient Instructions (Signed)
Your sleep study showed moderate sleep apnea with primarily central sleep apneic events. We will have you complete an in lab study to ensure you are well controlled with CPAP and if not, transition you to BiPAP. I will call you afterwards to discuss results and send orders for appropriate PAP therapy.  We discussed how untreated sleep apnea puts an individual at risk for cardiac arrhthymias, pulm HTN, DM, stroke and increases their risk for daytime accidents.   Follow up after starting PAP therapy with Dr. Wynona Neat or Philis Nettle, or sooner, if needed

## 2023-03-26 NOTE — Assessment & Plan Note (Signed)
Possibly due to untreated sleep apnea. Follow up with neurology as scheduled.

## 2023-04-01 ENCOUNTER — Encounter: Payer: Self-pay | Admitting: Family Medicine

## 2023-04-01 ENCOUNTER — Ambulatory Visit (INDEPENDENT_AMBULATORY_CARE_PROVIDER_SITE_OTHER): Payer: BC Managed Care – PPO | Admitting: Family Medicine

## 2023-04-01 VITALS — BP 118/80 | HR 81 | Temp 98.6°F | Ht 71.0 in | Wt 164.0 lb

## 2023-04-01 DIAGNOSIS — G4739 Other sleep apnea: Secondary | ICD-10-CM

## 2023-04-01 NOTE — Progress Notes (Unsigned)
D/w pt about OSA eval and planning.    Meds, vitals, and allergies reviewed.   ROS: Per HPI unless specifically indicated in ROS section   Nad Ncat Neck supple, no stridor Rrr Ctab No BLE edema.   I need clarity from pulmonary team about CPAP vs APAP plans.  Her concern is about CPAP not addressing her potentially variable breath to breath pressure requirement.

## 2023-04-01 NOTE — Patient Instructions (Signed)
Let me check with pulmonary and we'll be in touch.  Take care.  Glad to see you.

## 2023-04-02 ENCOUNTER — Telehealth: Payer: Self-pay | Admitting: Family Medicine

## 2023-04-02 NOTE — Assessment & Plan Note (Signed)
I need clarity from pulmonary team about CPAP vs APAP plans.  Her concern is about CPAP not addressing her potentially variable breath to breath pressure requirement.  I am going to ask for pulmonary input so we can present her with a clarified plan.

## 2023-04-02 NOTE — Telephone Encounter (Signed)
I need clarity from Dr. Wynona Neat and NP Allison Quarry about CPAP vs APAP plans and home vs lab testing.  The patient's concern is about CPAP not addressing her potentially variable breath to breath pressure requirement.    The initial plan from the sleep study was for auto CPAP at home but the plan after the last office visit is for CPAP titration study.    Please let me and the patient know.  Thanks.

## 2023-04-07 NOTE — Telephone Encounter (Signed)
She has mixed sleep apnea with total AHI 15.7/h, and CAI 10.6/h on home sleep study. Given the majority of events being central/mixed and not obstructive events, I advised in lab CPAP titration study to ensure she is well controlled on CPAP and rule out further emergent central events. This will provide [place] with the information needed to ensure she is on appropriate PAP therapy. Thanks.

## 2023-04-11 NOTE — Telephone Encounter (Signed)
Agree with Rhunette Croft  In lab titration may be the best option to ensure that events optimally treated.  Some central sleep apnea events noted may occur during sleep-wake transitions, this usually will get better with CPAP therapy.

## 2023-04-15 ENCOUNTER — Encounter: Payer: Self-pay | Admitting: Family Medicine

## 2023-04-15 ENCOUNTER — Ambulatory Visit (INDEPENDENT_AMBULATORY_CARE_PROVIDER_SITE_OTHER): Payer: BC Managed Care – PPO | Admitting: Family Medicine

## 2023-04-15 VITALS — BP 118/74 | HR 60 | Temp 98.3°F | Ht 71.0 in | Wt 168.0 lb

## 2023-04-15 DIAGNOSIS — J0100 Acute maxillary sinusitis, unspecified: Secondary | ICD-10-CM | POA: Diagnosis not present

## 2023-04-15 MED ORDER — AMOXICILLIN-POT CLAVULANATE 875-125 MG PO TABS: 1.0000 | ORAL_TABLET | Freq: Two times a day (BID) | ORAL | 0 refills | Status: DC

## 2023-04-15 NOTE — Progress Notes (Unsigned)
Maxillary antrostomy 2012, h/o TM surgery in the past.   duration of symptoms: 2 weeks, in spite of flonase and pseudophed.  Pain leaning forward with head dependent rhinorrhea: no congestion: yes, on R max area.   ear pain: no but prev with L ear pain.   sore throat: no cough: no Neg covid test myalgias: no No fevers.   She is travelling to [place] cruise in the near future.  D/w pt about plan for OSA eval.  She is schedule for that.    Per HPI unless specifically indicated in ROS section   Meds, vitals, and allergies reviewed.   GEN: nad, alert and oriented HEENT: mucous membranes moist, TM w/o erythema, nasal epithelium injected, OP with cobblestoning NECK: supple w/o LA CV: rrr. PULM: ctab, no inc wob EXT: no edema R max sinus ttp R TM w/o erythema but chronic changes noted.

## 2023-04-15 NOTE — Patient Instructions (Signed)
Augmentin, rest, fluids, flonase.   Take care.  Glad to see you.

## 2023-04-16 DIAGNOSIS — J0100 Acute maxillary sinusitis, unspecified: Secondary | ICD-10-CM | POA: Insufficient documentation

## 2023-04-16 NOTE — Assessment & Plan Note (Signed)
Nontoxic.  Okay for outpatient follow-up. Augmentin, rest, fluids, flonase.   Update [place] as needed.

## 2023-05-08 ENCOUNTER — Encounter: Payer: Self-pay | Admitting: Family Medicine

## 2023-05-29 ENCOUNTER — Telehealth: Payer: Self-pay | Admitting: Nurse Practitioner

## 2023-05-29 NOTE — Telephone Encounter (Signed)
Previous message from patient 05/14/23 wanted to postpone sleep study until her sinus issues were cleared up. We received a note today from [facility] Patient has refused to schedule sleep study.  Seeing another physician for different issue and does not want to schedule until the issue is resolved

## 2023-06-16 ENCOUNTER — Encounter: Payer: Self-pay | Admitting: Family Medicine

## 2023-06-19 DIAGNOSIS — D2262 Melanocytic nevi of left upper limb, including shoulder: Secondary | ICD-10-CM | POA: Diagnosis not present

## 2023-06-19 DIAGNOSIS — D225 Melanocytic nevi of trunk: Secondary | ICD-10-CM | POA: Diagnosis not present

## 2023-06-19 DIAGNOSIS — D2261 Melanocytic nevi of right upper limb, including shoulder: Secondary | ICD-10-CM | POA: Diagnosis not present

## 2023-06-19 DIAGNOSIS — D2272 Melanocytic nevi of left lower limb, including hip: Secondary | ICD-10-CM | POA: Diagnosis not present

## 2023-06-23 DIAGNOSIS — J320 Chronic maxillary sinusitis: Secondary | ICD-10-CM | POA: Diagnosis not present

## 2023-07-09 ENCOUNTER — Encounter: Payer: Self-pay | Admitting: Family Medicine

## 2023-07-15 ENCOUNTER — Encounter: Payer: Self-pay | Admitting: Family Medicine

## 2023-07-21 DIAGNOSIS — J328 Other chronic sinusitis: Secondary | ICD-10-CM | POA: Diagnosis not present

## 2023-08-04 ENCOUNTER — Ambulatory Visit: Payer: BC Managed Care – PPO | Admitting: Family Medicine

## 2023-08-04 ENCOUNTER — Encounter: Payer: Self-pay | Admitting: Family Medicine

## 2023-08-04 ENCOUNTER — Encounter: Payer: Self-pay | Admitting: *Deleted

## 2023-08-04 VITALS — BP 118/82 | HR 68 | Temp 98.2°F | Ht 71.0 in | Wt 167.0 lb

## 2023-08-04 DIAGNOSIS — R519 Headache, unspecified: Secondary | ICD-10-CM | POA: Diagnosis not present

## 2023-08-04 LAB — CBC WITH DIFFERENTIAL/PLATELET
Basophils Absolute: 0 10*3/uL (ref 0.0–0.1)
Basophils Relative: 1.3 % (ref 0.0–3.0)
Eosinophils Absolute: 0 10*3/uL (ref 0.0–0.7)
Eosinophils Relative: 1.3 % (ref 0.0–5.0)
HCT: 38.6 % (ref 36.0–46.0)
Hemoglobin: 12.6 g/dL (ref 12.0–15.0)
Lymphocytes Relative: 29.7 % (ref 12.0–46.0)
Lymphs Abs: 1 10*3/uL (ref 0.7–4.0)
MCHC: 32.6 g/dL (ref 30.0–36.0)
MCV: 91.5 fL (ref 78.0–100.0)
Monocytes Absolute: 0.3 10*3/uL (ref 0.1–1.0)
Monocytes Relative: 9.3 % (ref 3.0–12.0)
Neutro Abs: 2 10*3/uL (ref 1.4–7.7)
Neutrophils Relative %: 58.4 % (ref 43.0–77.0)
Platelets: 195 10*3/uL (ref 150.0–400.0)
RBC: 4.22 Mil/uL (ref 3.87–5.11)
RDW: 13.8 % (ref 11.5–15.5)
WBC: 3.5 10*3/uL — ABNORMAL LOW (ref 4.0–10.5)

## 2023-08-04 LAB — SEDIMENTATION RATE: Sed Rate: 2 mm/h (ref 0–30)

## 2023-08-04 NOTE — Telephone Encounter (Signed)
I have refaxed the order to [facility] for them to get the patient reapproved for Cpap Titration

## 2023-08-04 NOTE — Progress Notes (Unsigned)
Migraines.  She was having intermittent R sided HA, positional.  She had ENT eval in [place] in the meantime.    She can have pain in the R side of the scalp with laying on her L side at night.  She had a episode of laughing and had R sided head pain.  Can have pain with muscle strain, leaning over.  Can have frontal pain episodically.  No other consistent trigger.    No help with prednisone/augmentin.  Had sinus CT and that was unremarkable.    She tried to get an appointment with neuro, scheduled for end of January.    This pattern/pain isn't typical for her prev migraines.  She had some prev R shoulder pain.  She is still reading and functional with her ADLs.  Ibuprofen may help some.  No vision changes, no vision loss.  She doesn't have hypersensitive skin on her face.  No numbness.    Meds, vitals, and allergies reviewed.   ROS: Per HPI unless specifically indicated in ROS section    CN 2-12 wnl B, S/S/DTR wnl x4 R trap still sore.     Sleep apnea- d/w pt about proceeding with sleep eval.   Temporal arteritis.   Intracran path Muscle trigger.  Trigeminal neuralgia- V1 and V2.  Never crosses the midline.

## 2023-08-04 NOTE — Patient Instructions (Addendum)
Please call pulmonary about sleep apnea.   Go to the lab on the way out.   If you have mychart we'll likely use that to update you.    I would keep the appointment with neuro. You should get a call about the head CT. Take care.  Glad to see you.

## 2023-08-05 NOTE — Telephone Encounter (Signed)
I have received a note from [facility] the patient has been scheduled on 09/01/2023

## 2023-08-06 DIAGNOSIS — R519 Headache, unspecified: Secondary | ICD-10-CM | POA: Insufficient documentation

## 2023-08-06 NOTE — Assessment & Plan Note (Signed)
This appears to be separate from her previous typical migraines.  She has follow-up with neurology pending.  Reassuring neurologic exam today at office visit.  Okay for outpatient follow-up.  Discussed potential differential/contributing issues. Sleep apnea- d/w pt about proceeding with sleep eval. she can call about that. Temporal arteritis.  Not exquisitely tender but discussed checking sed rate/CBC.  See notes on labs. Intracranial pathology-unlikely but discussed getting head CT given her symptoms. Muscle trigger from trapezius strain/irritation. Trigeminal neuralgia is possible-she can have pain along right V1 and V2 and it never crosses the midline.    She could also have a combination of the above.  Reasonable to check CT, see notes on labs.  Follow-up with neurology.  She will call about sleep apnea evaluation.  32 minutes were devoted to patient care in this encounter (this includes time spent reviewing the patient's file/history, interviewing and examining the patient, counseling/reviewing plan with patient).

## 2023-08-07 ENCOUNTER — Ambulatory Visit
Admission: RE | Admit: 2023-08-07 | Discharge: 2023-08-07 | Disposition: A | Discharge status: Home / Self Care | Payer: BC Managed Care – PPO | Source: Ambulatory Visit | Attending: Family Medicine | Admitting: Family Medicine

## 2023-08-07 DIAGNOSIS — G43909 Migraine, unspecified, not intractable, without status migrainosus: Secondary | ICD-10-CM | POA: Diagnosis not present

## 2023-08-07 DIAGNOSIS — R519 Headache, unspecified: Secondary | ICD-10-CM

## 2023-08-14 ENCOUNTER — Encounter: Payer: Self-pay | Admitting: Family Medicine

## 2023-09-01 ENCOUNTER — Ambulatory Visit: Payer: BC Managed Care – PPO | Attending: Otolaryngology

## 2023-09-01 DIAGNOSIS — G4739 Other sleep apnea: Secondary | ICD-10-CM | POA: Diagnosis not present

## 2023-09-01 DIAGNOSIS — R0683 Snoring: Secondary | ICD-10-CM | POA: Diagnosis not present

## 2023-09-03 ENCOUNTER — Telehealth: Payer: Self-pay | Admitting: Family Medicine

## 2023-09-03 ENCOUNTER — Encounter: Payer: Self-pay | Admitting: Family Medicine

## 2023-09-03 DIAGNOSIS — G4733 Obstructive sleep apnea (adult) (pediatric): Secondary | ICD-10-CM

## 2023-09-03 NOTE — Telephone Encounter (Signed)
 Please see her MyChart message about her attempted sleep study.  Please let me know what can be done to remedy her situation.  Thank you.

## 2023-09-06 ENCOUNTER — Telehealth: Payer: Self-pay | Admitting: Pulmonary Disease

## 2023-09-06 NOTE — Telephone Encounter (Signed)
 CPAP 7 cm required

## 2023-09-08 ENCOUNTER — Telehealth: Payer: Self-pay | Admitting: Pulmonary Disease

## 2023-09-08 NOTE — Telephone Encounter (Signed)
 Patient is returning a missed call she can be reached at [phone]

## 2023-09-08 NOTE — Telephone Encounter (Signed)
 Appreciate pulmonary input

## 2023-09-08 NOTE — Telephone Encounter (Signed)
 I read her MyChart message and concerns. Sometimes this happens. People do tend to sleep more restfully at their home in their own bed. I think the best next step is to start her CPAP and we can adjust from there based on the feedback from the machine, if needed. This will tell us  how well she is controlled and if she is having any worsening central events. Per Dr. Jude, required 7 cmH2O on CPAP during her study so we will start her at that on a set pressure and avoid the auto titration.   Nikki, please let pt know of the above and send orders for new start CPAP 7 cmH2O, mask of choice and heated humidity. Schedule pt for f/u in 10-12 weeks. Thanks.

## 2023-09-08 NOTE — Telephone Encounter (Signed)
 Attempted to call patient. No answer. LMOM for patient to return call.

## 2023-09-09 ENCOUNTER — Encounter (HOSPITAL_BASED_OUTPATIENT_CLINIC_OR_DEPARTMENT_OTHER): Payer: Self-pay

## 2023-09-09 NOTE — Telephone Encounter (Signed)
 Patient states she was there at 8 and didn't get to sleep at 1 am. She doesn't think that this is a good test. She does not want any medical decisions based on this test. She will never go back to this facility. She states there is no way this is significant information since she missed most of her nightly sleep. She states that she normally goes to sleep at 05-1029 but she gets to sleep fast. So her 1 am going to bed due to their electronic failures is not a clear test of her sleep. She tried to reschedule after waking up at 4 and was told she had to leave after signing AMA but she is not happy. She is not happy that 3 providers have been involved and she is not sure what is needed at this point. Please advise.

## 2023-09-09 NOTE — Telephone Encounter (Signed)
 LMOM for pt to return call. Also sent a message via MyChart. Order has been placed for CPAP.

## 2023-09-09 NOTE — Addendum Note (Signed)
 Addended by: Jama Flavors on: 09/09/2023 09:04 AM   Modules accepted: Orders

## 2023-09-09 NOTE — Telephone Encounter (Signed)
 Spoke with patient.

## 2023-09-10 NOTE — Telephone Encounter (Signed)
 I spoke with the pt this morning around 930am. We had a very lengthy discussion surrounding the results of her CPAP titration and potential next steps including starting CPAP at set pressure 7 cmH2O vs repeating in lab study. Believe she would tolerate CPAP well as she did not have any emergent central events noted during her study, even though there was only approx. 3 hours of monitored sleep and no REM. Advised her we would monitor her downloads closely to ensure she was well controlled. She is going to think about how she would like to proceed and notify us .   Regarding her in lab study, she had a very poor experience. Her difficulties were not with sleep onset/maintenance due to being uncomfortable. She tends to be able to fall asleep anywhere. She typically goes to bed by 10 pm and wakes earlier in the mornings. Due to multiple technical issues, they did not start the study until after 1 am. The patient had requested they rescheduled but they declined and then were able to get the equipment working. They had numerous difficulties throughout the night and she was awoken after 2 hours. She was told by the tech they needed at least 3 hours of data. Given her experience at that point, she had requested they reschedule her test and try again at a later point. The manager informed her that she could leave the study but would have to sign out AMA, even though the difficulties were on their end with multiple equipment failures/malfunctions. She was aware that she would be financially responsible if she signed out AMA so she tried to get another hour of sleep so she could complete the study. She is understandably very frustrated and upset by this experience. She also is disappointed that the study was limited and she feels inconclusive due to the lack of efficiency on the sleep lab's part. She has requested I forward this information on to them, which I advised her I would do.  Margie, can you please see who we need  to send the above information to, to address this patient's concern?

## 2023-09-10 NOTE — Telephone Encounter (Signed)
 Message sent to [facility] CMA at 930 to let her know to call patient. Patient is unhappy.

## 2023-09-10 NOTE — Telephone Encounter (Signed)
 I have spoken to [place] with [facility] and requested to speak to management to discussed patient's experience and concerns. Carmelina Chinchilla stated that she was unable to reach a supervisor, however she would take a message and ask that they return my call.

## 2023-09-12 ENCOUNTER — Ambulatory Visit (INDEPENDENT_AMBULATORY_CARE_PROVIDER_SITE_OTHER): Payer: BC Managed Care – PPO | Admitting: Family Medicine

## 2023-09-12 ENCOUNTER — Encounter: Payer: Self-pay | Admitting: Family Medicine

## 2023-09-12 VITALS — BP 120/72 | HR 67 | Temp 97.8°F | Ht 71.0 in | Wt 170.2 lb

## 2023-09-12 DIAGNOSIS — G4739 Other sleep apnea: Secondary | ICD-10-CM

## 2023-09-12 NOTE — Progress Notes (Unsigned)
D/w pt about sleep study and recent events.  We talked about options with the plan to start with home CPAP.  Discussed that she didn't have typical data collection (and the night at the sleep lab was largely counter productive) so the results may not be completely reliable but the plan for home CPAP use and monitoring still makes sense.  Also discussed that the pulmonary department historically has a good reputation for care and her situation was atypical.    Her R sided HA are some better and the plan is to see how she feels with CPAP.    Meds, vitals, and allergies reviewed.   ROS: Per HPI unless specifically indicated in ROS section   Nad Ncat Rrr Ctab

## 2023-09-12 NOTE — Patient Instructions (Signed)
No charge for the visit.  I would start with home CPAP and let me know how that goes.

## 2023-09-14 NOTE — Assessment & Plan Note (Signed)
I would start with home CPAP and she can let me know how that goes.

## 2023-09-25 NOTE — Telephone Encounter (Signed)
I'm waiting on Dr. Vassie Loll regarding the request to add an addendum to the sleep study encounter. If he cannot do this, I can add a note to the procedure encounter that future providers should review. I did already document in her chart the difficulties she experienced so it's permanent record.  As far as getting the CPAP goes, recommend she reach out to the DME so she can set up the appt to pick it up if they haven't called her yet. The order is in her chart and looks like it's been confirmed as received from what I can tell. If she doesn't get anywhere with them when she calls, please advise her to contact us and let us know so we can reach out to [place] or [place]. Thanks!

## 2023-10-08 ENCOUNTER — Encounter: Payer: Self-pay | Admitting: Family Medicine

## 2023-10-09 DIAGNOSIS — G4733 Obstructive sleep apnea (adult) (pediatric): Secondary | ICD-10-CM | POA: Diagnosis not present

## 2023-10-09 NOTE — Telephone Encounter (Signed)
I unfortunately do not have anything to do with billing or insurance, especially with the sleep lab since it's a separate entity from [place]. She needs to reach out to the billing department, explain the situation and she can also provide the documentation in her chart. She can see about an appeal process with them.

## 2023-11-05 NOTE — Telephone Encounter (Signed)
 Can you follow up with the PCCs and let pt know we're waiting for it to be scanned back in? Thanks.

## 2023-11-05 NOTE — Telephone Encounter (Signed)
 Diane White, Dr Vassie Loll said that it had to do with [facility] and you knew how to print it out.

## 2023-11-06 DIAGNOSIS — G4733 Obstructive sleep apnea (adult) (pediatric): Secondary | ICD-10-CM | POA: Diagnosis not present

## 2023-11-07 NOTE — Telephone Encounter (Signed)
 I am not familiar with this. PCC's should be able to assist.

## 2023-11-10 ENCOUNTER — Ambulatory Visit
Admission: RE | Admit: 2023-11-10 | Discharge: 2023-11-10 | Disposition: A | Discharge status: Home / Self Care | Source: Ambulatory Visit | Attending: Family Medicine | Admitting: Family Medicine

## 2023-11-10 ENCOUNTER — Ambulatory Visit (INDEPENDENT_AMBULATORY_CARE_PROVIDER_SITE_OTHER): Admitting: Family Medicine

## 2023-11-10 ENCOUNTER — Encounter: Payer: Self-pay | Admitting: Family Medicine

## 2023-11-10 VITALS — BP 134/80 | HR 65 | Temp 97.7°F | Ht 71.0 in | Wt 171.8 lb

## 2023-11-10 DIAGNOSIS — M47812 Spondylosis without myelopathy or radiculopathy, cervical region: Secondary | ICD-10-CM | POA: Diagnosis not present

## 2023-11-10 DIAGNOSIS — M542 Cervicalgia: Secondary | ICD-10-CM

## 2023-11-10 DIAGNOSIS — G4730 Sleep apnea, unspecified: Secondary | ICD-10-CM

## 2023-11-10 MED ORDER — PREDNISONE 20 MG PO TABS: ORAL_TABLET | ORAL | 0 refills | Status: DC

## 2023-11-10 NOTE — Patient Instructions (Addendum)
 Ask for input from Dr. Wynona Neat.   It may be reasonable to use flonase in the meantime.  Take care.  Glad to see you.  Try prednisone in the meantime with food.  Try heat/ice on your neck, gently stretch.   Massage and acupuncture may help.

## 2023-11-10 NOTE — Progress Notes (Unsigned)
 She is using CPAP but had ETD/ear pressure after use.  Her events per night have improved in the meantime.  She is going to see Dr. Wynona Neat, d/w pt.  Fatigue may be some better in the meantime.  D/w pt about trying flonase.   D/w pt about HA.  D/w pt about prev PT for muscle tension near the B trapezius.  She went cross country skiing and "it set the whole thing off again."  She shoveled some mulch and then had return of pain.  This isn't typical for her migraines.  She had neg imaging. She has been going to a massage therapist and that helps a little.  She can have pain radiate down the R arm.    Meds, vitals, and allergies reviewed.   ROS: Per HPI unless specifically indicated in ROS section   R trap tight.

## 2023-11-12 ENCOUNTER — Telehealth: Payer: Self-pay | Admitting: Family Medicine

## 2023-11-12 ENCOUNTER — Telehealth: Payer: Self-pay | Admitting: Pulmonary Disease

## 2023-11-12 DIAGNOSIS — M542 Cervicalgia: Secondary | ICD-10-CM | POA: Insufficient documentation

## 2023-11-12 DIAGNOSIS — G4733 Obstructive sleep apnea (adult) (pediatric): Secondary | ICD-10-CM

## 2023-11-12 NOTE — Telephone Encounter (Signed)
 Spoke with patient regarding prior message. Patient stated she has been having ear pain started 4-5 nights ago . Patient was wondering if her pressure could be change . Patient went on and said she did have a CPAP titration study  on 09/01/2023 and stated she didn't sleep good because they didn't get her a bed until 1am and did not sleep good . Dr.Olalere can you please advise. I did print out a download also.

## 2023-11-12 NOTE — Telephone Encounter (Signed)
 Please update patient.  I am awaiting the over read on her x-ray.  It looks like she could have some foraminal narrowing that could cause impingement.  I do not see any other significant findings.  I think it makes sense to try prednisone as planned.  Please let me know if does not help.  Thanks.

## 2023-11-12 NOTE — Telephone Encounter (Signed)
 DME referral to decrease pressure to 6  I reviewed the download from the machine showing 3.1 events an hour  As long as number of events is less than 5-optimal control is anything below 5 Number of events on your initial sleep study was 15.7  Goal is to have you comfortable with a CPAP and to get good nights rest

## 2023-11-12 NOTE — Assessment & Plan Note (Signed)
 She is going to ask for input from Dr. Wynona Neat.   It may be reasonable to use flonase in the meantime.

## 2023-11-12 NOTE — Telephone Encounter (Signed)
 Patient states that she is having ear pain and pressure due to the use of her CPAP machine. Her next appointment isn't until the end of April and was wondering if there was something she could do in the mean time like have the pressure setting adjusted. Please call and advise [phone]

## 2023-11-12 NOTE — Telephone Encounter (Signed)
 Spoke with patient regarding prior message. Advised patient pert Dr.Olalere DME referral to decrease pressure to 6   Patient's voice was understanding.Nothing else further needed.

## 2023-11-12 NOTE — Assessment & Plan Note (Signed)
 Discussed options and anatomy.  Check plain films. Try prednisone in the meantime with food.  Steroid cautions discussed with patient. Try heat/ice on your neck, gently stretch.   Massage and acupuncture may help.

## 2023-11-13 ENCOUNTER — Encounter: Payer: Self-pay | Admitting: Pulmonary Disease

## 2023-11-13 NOTE — Telephone Encounter (Signed)
 Patient notified

## 2023-11-17 ENCOUNTER — Telehealth: Payer: Self-pay

## 2023-11-17 NOTE — Telephone Encounter (Signed)
 Fax referral information (missing DOB) back to [facility] as requested by PAP- RT

## 2023-11-18 ENCOUNTER — Other Ambulatory Visit: Payer: Self-pay

## 2023-11-18 DIAGNOSIS — G4733 Obstructive sleep apnea (adult) (pediatric): Secondary | ICD-10-CM

## 2023-11-18 NOTE — Telephone Encounter (Signed)
 Please send in order to decrease CPAP pressure to 5 cm from 6cm  Order to [facility]

## 2023-11-24 NOTE — Telephone Encounter (Signed)
 Synetta Fail, can you help me obtain this sleep study? Thank you

## 2023-11-24 NOTE — Telephone Encounter (Signed)
 I am not familiar with med Henreitta Leber.  Dr. Vassie Loll, are you able to print this study so it can be scanned into pt's chart?

## 2023-11-25 ENCOUNTER — Encounter: Payer: Self-pay | Admitting: Family Medicine

## 2023-11-30 ENCOUNTER — Other Ambulatory Visit: Payer: Self-pay | Admitting: Family Medicine

## 2023-11-30 DIAGNOSIS — E785 Hyperlipidemia, unspecified: Secondary | ICD-10-CM

## 2023-12-01 NOTE — Telephone Encounter (Signed)
 I now have a copy of the sleep study I will ask Melissa to scan into Epic again

## 2023-12-01 NOTE — Telephone Encounter (Signed)
 I have called twice to get a copy of the addendum sleep study

## 2023-12-02 ENCOUNTER — Ambulatory Visit (INDEPENDENT_AMBULATORY_CARE_PROVIDER_SITE_OTHER): Admitting: Family Medicine

## 2023-12-02 ENCOUNTER — Encounter: Payer: Self-pay | Admitting: Family Medicine

## 2023-12-02 VITALS — BP 118/62 | HR 77 | Temp 98.2°F | Ht 71.0 in | Wt 171.6 lb

## 2023-12-02 DIAGNOSIS — M542 Cervicalgia: Secondary | ICD-10-CM | POA: Diagnosis not present

## 2023-12-02 MED ORDER — PREDNISONE 20 MG PO TABS: ORAL_TABLET | ORAL | 0 refills | Status: DC

## 2023-12-02 NOTE — Patient Instructions (Addendum)
 Let me know if you can't get schedule with Dr. Danielle Dess.  If you have more symptoms, then restart prednisone with food.  Take care.  Glad to see you.

## 2023-12-02 NOTE — Progress Notes (Unsigned)
 D/w pt about CPAP use.  She is on lower pressure setting now and ear sx are better.  Fatigue is better and Dr. Wynona Neat has been helpful for patient.   Magnesium helped with migraines.    She has a separate issue with headaches and neck pain, separate from migraines.  Prednisone and massage and stretching helped some.  D/w pt about prev xray.  She has less R arm sx now.  She is better but not symptom free.  Discussed options going forward.  Meds, vitals, and allergies reviewed.   ROS: Per HPI unless specifically indicated in ROS section   Nad Ncat Neck supple but R trap area tight.   rrr Grip intact B S/S grossly wnl.

## 2023-12-03 NOTE — Assessment & Plan Note (Signed)
 Discussed options and I think it would make sense to see the neurosurgery clinic.  D/w pt about seeing Dr. Danielle Dess, who is deservedly well-respected in the community.  I would like his input for surgical versus nonsurgical options.  She agrees with plan.  X-rays reviewed with patient at office visit.

## 2023-12-11 ENCOUNTER — Other Ambulatory Visit (INDEPENDENT_AMBULATORY_CARE_PROVIDER_SITE_OTHER): Payer: BC Managed Care – PPO

## 2023-12-11 DIAGNOSIS — E785 Hyperlipidemia, unspecified: Secondary | ICD-10-CM | POA: Diagnosis not present

## 2023-12-11 LAB — LIPID PANEL
Cholesterol: 200 mg/dL (ref 0–200)
HDL: 87 mg/dL (ref >39.00)
LDL Cholesterol: 103 mg/dL — ABNORMAL HIGH (ref 0–99)
NonHDL: 113.16
Total CHOL/HDL Ratio: 2
Triglycerides: 49 mg/dL (ref 0.0–149.0)
VLDL: 9.8 mg/dL (ref 0.0–40.0)

## 2023-12-11 LAB — CBC WITH DIFFERENTIAL/PLATELET
Basophils Absolute: 0 10*3/uL (ref 0.0–0.1)
Basophils Relative: 0.7 % (ref 0.0–3.0)
Eosinophils Absolute: 0.1 10*3/uL (ref 0.0–0.7)
Eosinophils Relative: 1.8 % (ref 0.0–5.0)
HCT: 39.9 % (ref 36.0–46.0)
Hemoglobin: 13.2 g/dL (ref 12.0–15.0)
Lymphocytes Relative: 39.2 % (ref 12.0–46.0)
Lymphs Abs: 1.2 10*3/uL (ref 0.7–4.0)
MCHC: 33 g/dL (ref 30.0–36.0)
MCV: 90.8 fl (ref 78.0–100.0)
Monocytes Absolute: 0.3 10*3/uL (ref 0.1–1.0)
Monocytes Relative: 8.6 % (ref 3.0–12.0)
Neutro Abs: 1.6 10*3/uL (ref 1.4–7.7)
Neutrophils Relative %: 49.7 % (ref 43.0–77.0)
Platelets: 181 10*3/uL (ref 150.0–400.0)
RBC: 4.4 Mil/uL (ref 3.87–5.11)
RDW: 13.6 % (ref 11.5–15.5)
WBC: 3.2 10*3/uL — ABNORMAL LOW (ref 4.0–10.5)

## 2023-12-11 LAB — COMPREHENSIVE METABOLIC PANEL WITH GFR
ALT: 15 U/L (ref 0–35)
AST: 20 U/L (ref 0–37)
Albumin: 4.6 g/dL (ref 3.5–5.2)
Alkaline Phosphatase: 44 U/L (ref 39–117)
BUN: 14 mg/dL (ref 6–23)
CO2: 33 meq/L — ABNORMAL HIGH (ref 19–32)
Calcium: 9.7 mg/dL (ref 8.4–10.5)
Chloride: 103 meq/L (ref 96–112)
Creatinine, Ser: 0.94 mg/dL (ref 0.40–1.20)
GFR: 66.93 mL/min (ref >60.00)
Glucose, Bld: 90 mg/dL (ref 70–99)
Potassium: 4.1 meq/L (ref 3.5–5.1)
Sodium: 139 meq/L (ref 135–145)
Total Bilirubin: 0.7 mg/dL (ref 0.2–1.2)
Total Protein: 6.8 g/dL (ref 6.0–8.3)

## 2023-12-16 ENCOUNTER — Ambulatory Visit: Payer: BC Managed Care – PPO | Admitting: Pulmonary Disease

## 2023-12-16 ENCOUNTER — Encounter: Payer: Self-pay | Admitting: Pulmonary Disease

## 2023-12-16 VITALS — BP 159/89 | HR 67 | Ht 72.0 in | Wt 171.0 lb

## 2023-12-16 DIAGNOSIS — G4733 Obstructive sleep apnea (adult) (pediatric): Secondary | ICD-10-CM

## 2023-12-16 NOTE — Patient Instructions (Signed)
 Continue using your CPAP nightly  Call us  with significant concerns  I will see you back in about 3 months  You may change the pressure as we discussed As long as you continue to feel better overall, pressures can be adjusted to your comfort level  Download from your machine shows it is working well

## 2023-12-16 NOTE — Progress Notes (Signed)
 Diane White    [id number]    15-Jul-1966  Primary Care Physician:Duncan, Gwynda Leriche, MD  Referring Physician: Donnie Galea, MD [address] [place],  [place] [address]  Chief complaint:   Patient with obstructive sleep apnea Diagnosed with moderate obstructive sleep apnea  HPI:  Moderate obstructive sleep apnea Has been using CPAP Has required reduction in CPAP pressures secondary to air discomfort  Has a history of multiple ear surgeries  When she was started on CPAP started having pressure buildup in the ear Currently on a CPAP pressure of 5, appears to be tolerating this fairly well  Very compliant with treatment and keeps a close eye on a CPAP  She did have concerns about experience when she had a titration study, she felt it was suboptimal because she was not hooked up in good time, a lot of equipment failures, she got hooked up very late and may not have slept for enough hours replicating what she is able to do at home  Does not smoke  Outpatient Encounter Medications as of 12/16/2023  Medication Sig   Calcium-Vitamin D-Vitamin K (CALCIUM SOFT CHEWS) [phone] MG-UNT-MCG CHEW Chew 1 tablet by mouth daily.   Cholecalciferol 50 MCG (2000 UT) CAPS Take by mouth.   fluticasone (FLONASE) 50 MCG/ACT nasal spray Place 1-2 sprays into both nostrils daily.   Magnesium  100 MG TABS Take 2 tablets (200 mg total) by mouth daily.   promethazine  (PROMETHEGAN) 25 MG suppository Place 1 suppository (25 mg total) rectally every 6 (six) hours as needed for nausea or vomiting.   SUMAtriptan  (IMITREX ) 50 MG tablet Take as needed for migraine.  Can repeat with second dose in 2 hours if needed.  Max 2 doses in 24 hours.   predniSONE  (DELTASONE ) 20 MG tablet Take 2 a day for 5 days, then 1 a day for 5 days, with food. Don't take with aleve/ibuprofen. (Patient not taking: Reported on 12/16/2023)   No facility-administered encounter medications on file as of 12/16/2023.     Allergies as of 12/16/2023   (No Known Allergies)    Past Medical History:  Diagnosis Date   Chicken pox    History of breast cancer    Lumpectomy and radiation at age 8.   History of skin cancer    Status post Mohs surgery, right hand.   Inflammatory polyps of colon with unspecified complications (HCC)    Migraines     Past Surgical History:  Procedure Laterality Date   BREAST BIOPSY     BREAST LUMPECTOMY     INGUINAL HERNIA REPAIR Right    NASAL SINUS SURGERY     Maxillary antrostomy 2012   TONSILLECTOMY AND ADENOIDECTOMY     TYMPANOSTOMY TUBE PLACEMENT     Multiple times, along with eardrum repair   VARICOSE VEIN SURGERY     injection and sclerotherapy    Family History  Problem Relation Age of Onset   Hypertension Mother    Hyperlipidemia Mother    Heart disease Mother    Arthritis Mother    Hypertension Father    Hyperlipidemia Father    Heart disease Father    Diabetes Father    Cancer Father    Hyperlipidemia Brother    Stroke Maternal Grandmother    Cancer Maternal Grandmother    Cancer Maternal Grandfather    Colon cancer Other        cousin    Social History   Socioeconomic  History   Marital status: Married    Spouse name: Not on file   Number of children: Not on file   Years of education: Not on file   Highest education level: Master's degree (e.g., MA, MS, MEng, MEd, MSW, MBA)  Occupational History   Not on file  Tobacco Use   Smoking status: Never   Smokeless tobacco: Never  Substance and Sexual Activity   Alcohol use: Yes   Drug use: Never   Sexual activity: Yes  Other Topics Concern   Not on file  Social History Narrative   Married 2008   From [place] .   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[employer], Psychologist, sport and exercise   Social Drivers of Health   Financial Resource Strain: Low Risk  (09/11/2023)   Overall Financial Resource Strain (CARDIA)    Difficulty of Paying Living Expenses: Not hard at all   Food Insecurity: No Food Insecurity (09/11/2023)   Hunger Vital Sign    Worried About Running Out of Food in the Last Year: Never true    Ran Out of Food in the Last Year: Never true  Transportation Needs: No Transportation Needs (09/11/2023)   PRAPARE - Administrator, Civil Service (Medical): No    Lack of Transportation (Non-Medical): No  Physical Activity: Sufficiently Active (09/11/2023)   Exercise Vital Sign    Days of Exercise per Week: 6 days    Minutes of Exercise per Session: 50 min  Stress: No Stress Concern Present (09/11/2023)   [employer] of Occupational Health - Occupational Stress Questionnaire    Feeling of Stress : Not at all  Social Connections: Socially Integrated (09/11/2023)   Social Connection and Isolation Panel [NHANES]    Frequency of Communication with Friends and Family: More than three times a week    Frequency of Social Gatherings with Friends and Family: More than three times a week    Attends Religious Services: More than 4 times per year    Active Member of [employer] or Organizations: Yes    Attends Engineer, structural: More than 4 times per year    Marital Status: Married  Catering manager Violence: Not on file    Review of Systems  Respiratory:  Positive for apnea. Negative for shortness of breath.   Psychiatric/Behavioral:  Positive for sleep disturbance.     Vitals:   12/16/23 0822  BP: (!) 159/89  Pulse: 67  SpO2: 99%     Physical Exam Constitutional:      Appearance: Normal appearance.  HENT:     Head: Normocephalic.     Mouth/Throat:     Mouth: Mucous membranes are moist.  Eyes:     General: No scleral icterus. Cardiovascular:     Rate and Rhythm: Normal rate and regular rhythm.     Heart sounds: No murmur heard.    No friction rub.  Pulmonary:     Effort: No respiratory distress.     Breath sounds: No stridor. No wheezing or rhonchi.  Musculoskeletal:     Cervical back: No rigidity or tenderness.   Neurological:     Mental Status: She is alert.  Psychiatric:        Mood and Affect: Mood normal.    Data Reviewed: Sleep study reviewed showing moderate obstructive sleep apnea  Titrated to CPAP 8-10  Started on CPAP  Compliance data reviewed showing 97% compliance Average use of 6 hours 29 minutes CPAP of 5  Residual AHI of 0.9  Assessment:  Moderate obstructive sleep apnea  Improved daytime symptoms  Drinks less caffeinated beverages-not having as much decreasing energy levels during the day   Apnea is adequately treated with CPAP therapy  Earache and ear fullness related to CPAP use - She is managing to tolerate CPAP of 5  Plan/Recommendations: Discussed options going forward - She may try CPAP of 4 to see if this is better tolerated - She is tolerating CPAP of 5 relatively well at present and may be able to use it more long-term  She has noticed improved daytime symptoms  Encouraged to call with significant concerns  Follow-up in about 3 months  Encouraged to continue using CPAP nightly  Myer Artis MD [facility] 12/16/2023, 9:11 PM  CC: Donnie Galea, MD

## 2023-12-18 ENCOUNTER — Encounter: Payer: Self-pay | Admitting: Pulmonary Disease

## 2023-12-18 ENCOUNTER — Ambulatory Visit (INDEPENDENT_AMBULATORY_CARE_PROVIDER_SITE_OTHER): Payer: BC Managed Care – PPO | Admitting: Family Medicine

## 2023-12-18 ENCOUNTER — Encounter: Payer: Self-pay | Admitting: Family Medicine

## 2023-12-18 VITALS — BP 116/64 | HR 66 | Temp 98.1°F | Ht 71.46 in | Wt 169.8 lb

## 2023-12-18 DIAGNOSIS — Z0000 Encounter for general adult medical examination without abnormal findings: Secondary | ICD-10-CM | POA: Diagnosis not present

## 2023-12-18 DIAGNOSIS — M542 Cervicalgia: Secondary | ICD-10-CM

## 2023-12-18 DIAGNOSIS — G4739 Other sleep apnea: Secondary | ICD-10-CM

## 2023-12-18 DIAGNOSIS — Z7189 Other specified counseling: Secondary | ICD-10-CM

## 2023-12-18 DIAGNOSIS — G43909 Migraine, unspecified, not intractable, without status migrainosus: Secondary | ICD-10-CM

## 2023-12-18 NOTE — Patient Instructions (Signed)
 Update me as needed.  Prednisone  if needed. If using, then stop ibuprofen.  I'll await the update from Dr. Ellery Guthrie.  Take care.  Glad to see you.

## 2023-12-18 NOTE — Progress Notes (Signed)
 CPE- See plan.  Routine anticipatory guidance given to patient.  See health maintenance.  The possibility exists that previously documented standard health maintenance information may have been brought forward from a previous encounter into this note.  If needed, that same information has been updated to reflect the current situation based on today's encounter.    HIV and hepatitis C screening done in the 2000's, negative per patient report. Vaccine history d/w pt.   Living will d/w pt.  Husband then brother Josiah Nigh designated if patient were incapacitated.   Pap done per gyn.   Colonoscopy 2021  Her mother is living in skilled care, d/w pt.    She noted a metallic taste in her mouth over the last 6 months. H/o canker sores that resolved.  No dysphagia.  No pain now.  Unclear if her sx were related to retainer use.    Migraines are better overall.  Prn phenergan  and imitrex  use.    OSA/CPAP d/w pt.  She is going to try a lower pressure setting, at 4 instead of 5.   Discussed with patient about her neck pain.  She is trying to get seen by Dr. Ellery Guthrie.  She is on the cancellation list.  She took ibuprofen in the meantime and has been stretching.  She has prednisone  on hold if needed.  She can manage as is for now.    PMH and SH reviewed  Meds, vitals, and allergies reviewed.   ROS: Per HPI.  Unless specifically indicated otherwise in HPI, the patient denies:  General: fever. Eyes: acute vision changes ENT: sore throat Cardiovascular: chest pain Respiratory: SOB GI: vomiting GU: dysuria Musculoskeletal: acute back pain Derm: acute rash Neuro: acute motor dysfunction Psych: worsening mood Endocrine: polydipsia Heme: bleeding Allergy: hayfever  GEN: nad, alert and oriented HEENT: mucous membranes moist, OP wnl. No lesions seen.   NECK: supple w/o LA CV: rrr. PULM: ctab, no inc wob ABD: soft, +bs EXT: no edema SKIN: no acute rash  The 10-year ASCVD risk score (Arnett DK, et al.,  2019) is: 1.5%   Values used to calculate the score:     Age: 58 years     Sex: Female     Is Non-Hispanic African American: No     Diabetic: No     Tobacco smoker: No     Systolic Blood Pressure: 116 mmHg     Is BP treated: No     HDL Cholesterol: 87 mg/dL     Total Cholesterol: 200 mg/dL  Labs discussed with patient.

## 2023-12-18 NOTE — Assessment & Plan Note (Signed)
 Living will d/w pt.  Husband then brother Josiah Nigh designated if patient were incapacitated.

## 2023-12-21 NOTE — Assessment & Plan Note (Signed)
 She is trying to get seen by Dr. Ellery Guthrie.  She is on the cancellation list.  She took ibuprofen in the meantime and has been stretching.  She has prednisone  on hold if needed.  She can manage as is for now.

## 2023-12-21 NOTE — Assessment & Plan Note (Signed)
 HIV and hepatitis C screening done in the 2000's, negative per patient report. Vaccine history d/w pt.   Living will d/w pt.  Husband then brother Josiah Nigh designated if patient were incapacitated.   Pap done per gyn.   Colonoscopy 2021

## 2023-12-21 NOTE — Assessment & Plan Note (Signed)
Continue CPAP as is.

## 2023-12-21 NOTE — Assessment & Plan Note (Signed)
 Migraines are better overall.  Prn phenergan  and imitrex  use.   Continue as is.

## 2024-01-06 DIAGNOSIS — G4733 Obstructive sleep apnea (adult) (pediatric): Secondary | ICD-10-CM | POA: Diagnosis not present

## 2024-01-29 ENCOUNTER — Encounter: Payer: Self-pay | Admitting: Family Medicine

## 2024-01-29 ENCOUNTER — Other Ambulatory Visit: Payer: Self-pay

## 2024-01-29 MED ORDER — SUMATRIPTAN SUCCINATE 50 MG PO TABS: ORAL_TABLET | ORAL | 5 refills | Status: AC

## 2024-02-06 DIAGNOSIS — G4733 Obstructive sleep apnea (adult) (pediatric): Secondary | ICD-10-CM | POA: Diagnosis not present

## 2024-02-11 DIAGNOSIS — S70362A Insect bite (nonvenomous), left thigh, initial encounter: Secondary | ICD-10-CM | POA: Diagnosis not present

## 2024-02-20 DIAGNOSIS — M47812 Spondylosis without myelopathy or radiculopathy, cervical region: Secondary | ICD-10-CM | POA: Diagnosis not present

## 2024-02-25 DIAGNOSIS — M47812 Spondylosis without myelopathy or radiculopathy, cervical region: Secondary | ICD-10-CM | POA: Insufficient documentation

## 2024-03-02 DIAGNOSIS — M47812 Spondylosis without myelopathy or radiculopathy, cervical region: Secondary | ICD-10-CM | POA: Diagnosis not present

## 2024-03-07 DIAGNOSIS — G4733 Obstructive sleep apnea (adult) (pediatric): Secondary | ICD-10-CM | POA: Diagnosis not present

## 2024-03-11 DIAGNOSIS — T07XXXA Unspecified multiple injuries, initial encounter: Secondary | ICD-10-CM | POA: Diagnosis not present

## 2024-03-11 DIAGNOSIS — D045 Carcinoma in situ of skin of trunk: Secondary | ICD-10-CM | POA: Diagnosis not present

## 2024-03-11 DIAGNOSIS — L538 Other specified erythematous conditions: Secondary | ICD-10-CM | POA: Diagnosis not present

## 2024-03-11 DIAGNOSIS — L820 Inflamed seborrheic keratosis: Secondary | ICD-10-CM | POA: Diagnosis not present

## 2024-03-11 DIAGNOSIS — L2989 Other pruritus: Secondary | ICD-10-CM | POA: Diagnosis not present

## 2024-03-11 DIAGNOSIS — L821 Other seborrheic keratosis: Secondary | ICD-10-CM | POA: Diagnosis not present

## 2024-03-11 DIAGNOSIS — D485 Neoplasm of uncertain behavior of skin: Secondary | ICD-10-CM | POA: Diagnosis not present

## 2024-03-11 DIAGNOSIS — L570 Actinic keratosis: Secondary | ICD-10-CM | POA: Diagnosis not present

## 2024-03-17 ENCOUNTER — Ambulatory Visit: Admitting: Pulmonary Disease

## 2024-03-17 VITALS — BP 123/70 | HR 63

## 2024-03-17 DIAGNOSIS — G4733 Obstructive sleep apnea (adult) (pediatric): Secondary | ICD-10-CM | POA: Diagnosis not present

## 2024-03-17 NOTE — Progress Notes (Signed)
 Diane White    [id number]    April 06, 1966  Primary Care Physician:Duncan, Arlyss RAMAN, MD  Referring Physician: Cleatus Arlyss RAMAN, MD [address] [place],  [place] [address]  Chief complaint:   Patient with obstructive sleep apnea Diagnosed with moderate obstructive sleep apnea  HPI:  Moderate obstructive sleep apnea Has been using CPAP Currently using CPAP of 4  Has had multiple ear surgeries on the right side, damaged drum Continues to follow-up with ENT - CPAP does put a lot of pressure on left ear and this is the reason for being on a very low pressure  Compliant with treatment  She did have concerns about experience when she had a titration study, she felt it was suboptimal because she was not hooked up in good time, a lot of equipment failures, she got hooked up very late and may not have slept for enough hours replicating what she is able to do at home  Does not smoke  Outpatient Encounter Medications as of 03/17/2024  Medication Sig   Calcium-Vitamin D-Vitamin K (CALCIUM SOFT CHEWS) [phone] MG-UNT-MCG CHEW Chew 1 tablet by mouth daily.   Cholecalciferol 50 MCG (2000 UT) CAPS Take by mouth.   Magnesium  100 MG TABS Take 2 tablets (200 mg total) by mouth daily.   promethazine  (PROMETHEGAN) 25 MG suppository Place 1 suppository (25 mg total) rectally every 6 (six) hours as needed for nausea or vomiting.   SUMAtriptan  (IMITREX ) 50 MG tablet Take as needed for migraine.  Can repeat with second dose in 2 hours if needed.  Max 2 doses in 24 hours.   [DISCONTINUED] fluticasone (FLONASE) 50 MCG/ACT nasal spray Place 1-2 sprays into both nostrils daily. (Patient not taking: Reported on 03/17/2024)   No facility-administered encounter medications on file as of 03/17/2024.    Allergies as of 03/17/2024   (No Known Allergies)    Past Medical History:  Diagnosis Date   Chicken pox    History of breast cancer    Lumpectomy and radiation at age 51.   History  of skin cancer    Status post Mohs surgery, right hand.   Inflammatory polyps of colon with unspecified complications (HCC)    Migraines    OSA (obstructive sleep apnea)     Past Surgical History:  Procedure Laterality Date   BREAST BIOPSY     BREAST LUMPECTOMY     INGUINAL HERNIA REPAIR Right    NASAL SINUS SURGERY     Maxillary antrostomy 2012   TONSILLECTOMY AND ADENOIDECTOMY     TYMPANOSTOMY TUBE PLACEMENT     Multiple times, along with eardrum repair   VARICOSE VEIN SURGERY     injection and sclerotherapy    Family History  Problem Relation Age of Onset   Hypertension Mother    Hyperlipidemia Mother    Heart disease Mother    Arthritis Mother    Hypertension Father    Hyperlipidemia Father    Heart disease Father    Diabetes Father    Cancer Father    Hyperlipidemia Brother    Stroke Maternal Grandmother    Cancer Maternal Grandmother    Cancer Maternal Grandfather    Colon cancer Other        cousin    Social History   Socioeconomic History   Marital status: Married    Spouse name: Not on file   Number of children: Not on file   Years of education: Not on  file   Highest education level: Master's degree (e.g., MA, MS, MEng, MEd, MSW, MBA)  Occupational History   Not on file  Tobacco Use   Smoking status: Never   Smokeless tobacco: Never  Substance and Sexual Activity   Alcohol use: Yes    Comment: rare   Drug use: Never   Sexual activity: Yes  Other Topics Concern   Not on file  Social History Narrative   Married 2008   From [place] .   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[employer], Psychologist, sport and exercise   Social Drivers of Health   Financial Resource Strain: Low Risk  (09/11/2023)   Overall Financial Resource Strain (CARDIA)    Difficulty of Paying Living Expenses: Not hard at all  Food Insecurity: No Food Insecurity (09/11/2023)   Hunger Vital Sign    Worried About Running Out of Food in the Last Year: Never true     Ran Out of Food in the Last Year: Never true  Transportation Needs: No Transportation Needs (09/11/2023)   PRAPARE - Administrator, Civil Service (Medical): No    Lack of Transportation (Non-Medical): No  Physical Activity: Sufficiently Active (09/11/2023)   Exercise Vital Sign    Days of Exercise per Week: 6 days    Minutes of Exercise per Session: 50 min  Stress: No Stress Concern Present (09/11/2023)   [employer] of Occupational Health - Occupational Stress Questionnaire    Feeling of Stress : Not at all  Social Connections: Socially Integrated (09/11/2023)   Social Connection and Isolation Panel    Frequency of Communication with Friends and Family: More than three times a week    Frequency of Social Gatherings with Friends and Family: More than three times a week    Attends Religious Services: More than 4 times per year    Active Member of [employer] or Organizations: Yes    Attends Engineer, structural: More than 4 times per year    Marital Status: Married  Catering manager Violence: Not on file    Review of Systems  Respiratory:  Positive for apnea. Negative for shortness of breath.   Psychiatric/Behavioral:  Positive for sleep disturbance.     Vitals:   03/17/24 0854 03/17/24 0855  BP:  123/70  Pulse: 63 63  SpO2: 98% 98%     Physical Exam Constitutional:      Appearance: Normal appearance.  HENT:     Head: Normocephalic.     Mouth/Throat:     Mouth: Mucous membranes are moist.  Eyes:     General: No scleral icterus. Cardiovascular:     Rate and Rhythm: Normal rate and regular rhythm.     Heart sounds: No murmur heard.    No friction rub.  Pulmonary:     Effort: No respiratory distress.     Breath sounds: No stridor. No wheezing or rhonchi.  Musculoskeletal:     Cervical back: No rigidity or tenderness.  Neurological:     Mental Status: She is alert.  Psychiatric:        Mood and Affect: Mood normal.    Data Reviewed: Sleep study  reviewed showing moderate obstructive sleep apnea  Titrated to CPAP 8-10  Currently on a CPAP of 4 Usage of 83%, CPAP of 4, no significant leaks, residual AHI of 2.0  Assessment:  Moderate obstructive sleep apnea - Well treated - Improved daytime symptoms  Feels well overall  Area, air fullness  related to CPAP is still present but tolerable  Plan/Recommendations: Continue CPAP - Maintain CPAP of 4  With maintained daytime symptoms  Follow-up in 6 months  Encouraged to call with significant concerns   Jennet Epley MD [facility] 03/17/2024, 9:15 AM  CC: Cleatus Arlyss RAMAN, MD

## 2024-03-17 NOTE — Patient Instructions (Addendum)
 Your CPAP is working very well  Number of events remain adequately controlled  Continue using CPAP nightly  I will see you back in about 6 months and if you find at that time we will go to a yearly visit  Call us  with significant concerns

## 2024-03-18 DIAGNOSIS — L570 Actinic keratosis: Secondary | ICD-10-CM | POA: Diagnosis not present

## 2024-03-18 DIAGNOSIS — D045 Carcinoma in situ of skin of trunk: Secondary | ICD-10-CM | POA: Diagnosis not present

## 2024-03-22 ENCOUNTER — Encounter: Payer: Self-pay | Admitting: Family Medicine

## 2024-03-22 ENCOUNTER — Ambulatory Visit: Payer: Self-pay | Admitting: Family Medicine

## 2024-03-22 DIAGNOSIS — Z1231 Encounter for screening mammogram for malignant neoplasm of breast: Secondary | ICD-10-CM | POA: Diagnosis not present

## 2024-03-22 LAB — HM MAMMOGRAPHY

## 2024-03-24 ENCOUNTER — Telehealth: Payer: Self-pay | Admitting: Licensed Clinical Social Worker

## 2024-03-24 NOTE — Telephone Encounter (Signed)
 Patient called to schedule genetic counseling appointment to update her genetic testing previously done at [facility] in 2009. Appointment scheduled for 8/12 at 2 pm  Dena Cary, MS, [facility] Genetic Counselor [email or web address] Phone: [phone]

## 2024-03-26 DIAGNOSIS — M47812 Spondylosis without myelopathy or radiculopathy, cervical region: Secondary | ICD-10-CM | POA: Diagnosis not present

## 2024-03-29 ENCOUNTER — Encounter: Payer: Self-pay | Admitting: Family Medicine

## 2024-03-29 ENCOUNTER — Other Ambulatory Visit (HOSPITAL_BASED_OUTPATIENT_CLINIC_OR_DEPARTMENT_OTHER): Payer: Self-pay

## 2024-04-01 DIAGNOSIS — M47812 Spondylosis without myelopathy or radiculopathy, cervical region: Secondary | ICD-10-CM | POA: Diagnosis not present

## 2024-04-06 ENCOUNTER — Inpatient Hospital Stay

## 2024-04-06 ENCOUNTER — Other Ambulatory Visit: Payer: Self-pay | Admitting: Licensed Clinical Social Worker

## 2024-04-06 ENCOUNTER — Encounter: Payer: Self-pay | Admitting: Licensed Clinical Social Worker

## 2024-04-06 ENCOUNTER — Inpatient Hospital Stay: Attending: Oncology | Admitting: Licensed Clinical Social Worker

## 2024-04-06 DIAGNOSIS — Z8042 Family history of malignant neoplasm of prostate: Secondary | ICD-10-CM

## 2024-04-06 DIAGNOSIS — Z808 Family history of malignant neoplasm of other organs or systems: Secondary | ICD-10-CM

## 2024-04-06 DIAGNOSIS — Z853 Personal history of malignant neoplasm of breast: Secondary | ICD-10-CM

## 2024-04-06 DIAGNOSIS — Z8041 Family history of malignant neoplasm of ovary: Secondary | ICD-10-CM

## 2024-04-06 DIAGNOSIS — Z800 Family history of malignant neoplasm of digestive organs: Secondary | ICD-10-CM

## 2024-04-06 LAB — GENETIC SCREENING ORDER

## 2024-04-06 NOTE — Progress Notes (Signed)
ge

## 2024-04-06 NOTE — Progress Notes (Signed)
 PRIMARY PROVIDER:  Cleatus Arlyss RAMAN, MD  PRIMARY REASON FOR VISIT:  1. History of breast cancer   2. Family history of prostate cancer   3. Family history of ovarian cancer   4. Family history of pancreatic cancer   5. Family history of melanoma   6. Family history of duodenal cancer      HISTORY OF PRESENT ILLNESS:   Ms. Norby, a 58 y.o. female, was seen for a [facility] cancer genetics consultation due to a history of breast cancer at 72, history of colon polyps, and family history of cancer. [name] presents to clinic today to discuss the possibility of a hereditary predisposition to cancer, genetic testing, and to further clarify her future cancer risks, as well as potential cancer risks for family members.   CANCER HISTORY:  At the age of 40, [name] was diagnosed with breast cancer. The treatment plan included lumpectomy and radiation. She underwent genetic testing through [facility] in 2009 (Myriad BRCA1/BRCA2) which was negative.   RELEVANT MEDICAL HISTORY:  Ovaries intact: yes.  Hysterectomy: no.  Menopausal status: postmenopausal.  Colonoscopy: yes; polyps in 2007 and 2018,  others have been normal. Mammogram within the last year: yes.   Past Medical History:  Diagnosis Date   Chicken pox    History of breast cancer    Lumpectomy and radiation at age 40.   History of skin cancer    Status post Mohs surgery, right hand.   Inflammatory polyps of colon with unspecified complications (HCC)    Migraines    OSA (obstructive sleep apnea)     Past Surgical History:  Procedure Laterality Date   BREAST BIOPSY     BREAST LUMPECTOMY     INGUINAL HERNIA REPAIR Right    NASAL SINUS SURGERY     Maxillary antrostomy 2012   TONSILLECTOMY AND ADENOIDECTOMY     TYMPANOSTOMY TUBE PLACEMENT     Multiple times, along with eardrum repair   VARICOSE VEIN SURGERY     injection and sclerotherapy    FAMILY HISTORY:  We obtained a detailed, 4-generation family history.  Significant  diagnoses are listed below: Family History  Problem Relation Age of Onset   Hypertension Mother    Hyperlipidemia Mother    Heart disease Mother    Arthritis Mother    Pancreatic cancer Mother 5       negative genetic testing   Hypertension Father    Hyperlipidemia Father    Heart disease Father    Diabetes Father    Prostate cancer Father    Cancer Father        duodenal cancer 53s, T cell lymph of scalp   Melanoma Father    Hyperlipidemia Brother    Stroke Maternal Grandmother    Cancer Maternal Grandmother    Pancreatic cancer Maternal Grandfather    Leukemia Maternal Grandfather    Colon cancer Other 83       cousin   Ovarian cancer Other 28   Breast cancer Maternal Cousin    [name] has 2 brothers, 1 sister, no cancers.  [name] mother was recently diagnosed with pancreatic cancer at 82 and underwent genetic testing which was normal/negative. Patient has 2 maternal uncles who had skin cancer, maternal cousin who had colon cancer at 24, maternal cousin who had esophageal cancer, maternal cousin who had Hodgkin's lymphoma, and maternal cousin who had breast cancer. Maternal grandfather had pancreatic cancer and leukemia.   [name] father had several cancers:  melanoma, basal cell carcinoma, cutaneous T cell lymphoma on scalp, prostate cancer (dx 60s), duodenal cancer (dx 70s).  Patient has 1 paternal uncle who had melanoma and other skin cancers, of note he had lots of sun exposure. His son died of brain tumors at 42. Patient has a paternal great aunt who had ovarian cancer at 41.  [name] is aware of previous family history of genetic testing for hereditary cancer risks. There is no reported Ashkenazi Jewish ancestry. There is no known consanguinity.    GENETIC COUNSELING ASSESSMENT: [name] is a 58 y.o. female with a personal history of breast cancer at 80 and family history of cancer which is somewhat suggestive of a hereditary cancer syndrome and predisposition to  cancer. We, therefore, discussed and recommended the following at today's visit.   DISCUSSION: We discussed that, in general, most cancer is not inherited in families, but instead is sporadic or familial. Sporadic cancers occur by chance and typically happen at older ages (>50 years) as this type of cancer is caused by genetic changes acquired during an individual's lifetime. Some families have more cancers than would be expected by chance; however, the ages or types of cancer are not consistent with a known genetic mutation or known genetic mutations have been ruled out. This type of familial cancer is thought to be due to a combination of multiple genetic, environmental, hormonal, and lifestyle factors. While this combination of factors likely increases the risk of cancer, the exact source of this risk is not currently identifiable or testable.    We discussed that approximately 10% of cancer is hereditary. Most cases of hereditary breast cancer are associated with BRCA1/BRCA2 genes, although there are other genes associated with hereditary cancer as well. Cancers and risks are gene specific.  While reassuring that her past BRCA1/BRCA2 testing was normal, it is also possible that our current testing technology may be able to pick up something in one of these genes that was missed  on older testing. We discussed that testing is beneficial for several reasons including knowing about cancer risks, identifying potential screening and risk-reduction options that may be appropriate, and to understand if other family members could be at risk for cancer and allow them to undergo genetic testing.   We reviewed the characteristics, features and inheritance patterns of hereditary cancer syndromes. We also discussed genetic testing, including the appropriate family members to test, the process of testing, insurance coverage and turn-around-time for results. We discussed the implications of a negative, positive and/or  variant of uncertain significant result. We recommended Ms. Araque pursue genetic testing for the [facility] CancerNext-Expanded+RNA gene panel.   The CancerNext-Expanded gene panel offered by [facility] and includes sequencing, rearrangement, and RNA analysis for the following 77 genes: AIP, ALK, APC, ATM, AXIN2, BAP1, BARD1, BMPR1A, BRCA1, BRCA2, BRIP1, CDC73, CDH1, CDK4, CDKN1B, CDKN2A, CEBPA, CHEK2, CTNNA1, DDX41, DICER1, ETV6, FH, FLCN, GATA2, LZTR1, MAX, MBD4, MEN1, MET, MLH1, MSH2, MSH3, MSH6, MUTYH, NF1, NF2, NTHL1, PALB2, PHOX2B, PMS2, POT1, PRKAR1A, PTCH1, PTEN, RAD51C, RAD51D, RB1, RET, RPS20, RUNX1, SDHA, SDHAF2, SDHB, SDHC, SDHD, SMAD4, SMARCA4, SMARCB1, SMARCE1, STK11, SUFU, TMEM127, TP53, TSC1, TSC2, VHL, and WT1 (sequencing and deletion/duplication); EGFR, HOXB13, KIT, MITF, PDGFRA, POLD1, and POLE (sequencing only); EPCAM and GREM1 (deletion/duplication only).   Based on Ms. Vendetti's personal and family history of cancer, she meets medical criteria for genetic testing. She has elected to pay the patient pay price for testing.   PLAN: After considering the risks, benefits, and limitations,  Ms. Kubicki provided informed consent to pursue genetic testing and the blood sample was sent to [facility] for analysis of the CancerNext-Expanded+RNA panel. Results should be available within approximately 2-3 weeks' time, at which point they will be disclosed by telephone to Ms. Huyser, as will any additional recommendations warranted by these results. Ms. Bertholf will receive a summary of her genetic counseling visit and a copy of her results once available. This information will also be available in Epic.   Ms. Avena's questions were answered to her satisfaction today. Our contact information was provided should additional questions or concerns arise. Thank you for the referral and allowing us  to share in the care of your patient.   Dena Cary, MS, [facility] Genetic  Counselor [email or web address] Phone: [phone]  25 minutes were spent on the date of the encounter in service to the patient including preparation, face-to-face consultation, documentation and care coordination. Dr. Delinda was available for discussion regarding this case.   _______________________________________________________________________ For Office Staff:  Number of people involved in session: 1 Was an Intern/ student involved with case: no

## 2024-04-06 NOTE — Progress Notes (Signed)
 PRIMARY PROVIDER:  Cleatus Arlyss RAMAN, MD  PRIMARY REASON FOR VISIT:  1. History of breast cancer   2. Family history of prostate cancer   3. Family history of ovarian cancer   4. Family history of pancreatic cancer   5. Family history of melanoma   6. Family history of duodenal cancer      HISTORY OF PRESENT ILLNESS:   [name], a 58 y.o. female, was seen for a [facility] cancer genetics consultation due to a history of breast cancer at 72, history of colon polyps, and family history of cancer. [name] presents to clinic today to discuss the possibility of a hereditary predisposition to cancer, genetic testing, and to further clarify her future cancer risks, as well as potential cancer risks for family members.   CANCER HISTORY:  At the age of 40, [name] was diagnosed with breast cancer. The treatment plan included lumpectomy and radiation. She underwent genetic testing through [facility] in 2009 (Myriad BRCA1/BRCA2) which was negative.   RELEVANT MEDICAL HISTORY:  Ovaries intact: yes.  Hysterectomy: no.  Menopausal status: postmenopausal.  Colonoscopy: yes; polyps in 2007 and 2018,  others have been normal. Mammogram within the last year: yes.   Past Medical History:  Diagnosis Date   Chicken pox    History of breast cancer    Lumpectomy and radiation at age 40.   History of skin cancer    Status post Mohs surgery, right hand.   Inflammatory polyps of colon with unspecified complications (HCC)    Migraines    OSA (obstructive sleep apnea)     Past Surgical History:  Procedure Laterality Date   BREAST BIOPSY     BREAST LUMPECTOMY     INGUINAL HERNIA REPAIR Right    NASAL SINUS SURGERY     Maxillary antrostomy 2012   TONSILLECTOMY AND ADENOIDECTOMY     TYMPANOSTOMY TUBE PLACEMENT     Multiple times, along with eardrum repair   VARICOSE VEIN SURGERY     injection and sclerotherapy    FAMILY HISTORY:  We obtained a detailed, 4-generation family history.  Significant  diagnoses are listed below: Family History  Problem Relation Age of Onset   Hypertension Mother    Hyperlipidemia Mother    Heart disease Mother    Arthritis Mother    Pancreatic cancer Mother 5       negative genetic testing   Hypertension Father    Hyperlipidemia Father    Heart disease Father    Diabetes Father    Prostate cancer Father    Cancer Father        duodenal cancer 53s, T cell lymph of scalp   Melanoma Father    Hyperlipidemia Brother    Stroke Maternal Grandmother    Cancer Maternal Grandmother    Pancreatic cancer Maternal Grandfather    Leukemia Maternal Grandfather    Colon cancer Other 83       cousin   Ovarian cancer Other 28   Breast cancer Maternal Cousin    [name] has 2 brothers, 1 sister, no cancers.  [name] mother was recently diagnosed with pancreatic cancer at 82 and underwent genetic testing which was normal/negative. Patient has 2 maternal uncles who had skin cancer, maternal cousin who had colon cancer at 24, maternal cousin who had esophageal cancer, maternal cousin who had Hodgkin's lymphoma, and maternal cousin who had breast cancer. Maternal grandfather had pancreatic cancer and leukemia.   [name] father had several cancers:  melanoma, basal cell carcinoma, cutaneous T cell lymphoma on scalp, prostate cancer (dx 60s), duodenal cancer (dx 70s).  Patient has 1 paternal uncle who had melanoma and other skin cancers, of note he had lots of sun exposure. His son died of brain tumors at 42. Patient has a paternal great aunt who had ovarian cancer at 41.  [name] is aware of previous family history of genetic testing for hereditary cancer risks. There is no reported Ashkenazi Jewish ancestry. There is no known consanguinity.    GENETIC COUNSELING ASSESSMENT: [name] is a 58 y.o. female with a personal history of breast cancer at 80 and family history of cancer which is somewhat suggestive of a hereditary cancer syndrome and predisposition to  cancer. We, therefore, discussed and recommended the following at today's visit.   DISCUSSION: We discussed that, in general, most cancer is not inherited in families, but instead is sporadic or familial. Sporadic cancers occur by chance and typically happen at older ages (>50 years) as this type of cancer is caused by genetic changes acquired during an individual's lifetime. Some families have more cancers than would be expected by chance; however, the ages or types of cancer are not consistent with a known genetic mutation or known genetic mutations have been ruled out. This type of familial cancer is thought to be due to a combination of multiple genetic, environmental, hormonal, and lifestyle factors. While this combination of factors likely increases the risk of cancer, the exact source of this risk is not currently identifiable or testable.    We discussed that approximately 10% of cancer is hereditary. Most cases of hereditary breast cancer are associated with BRCA1/BRCA2 genes, although there are other genes associated with hereditary cancer as well. Cancers and risks are gene specific.  While reassuring that her past BRCA1/BRCA2 testing was normal, it is also possible that our current testing technology may be able to pick up something in one of these genes that was missed  on older testing. We discussed that testing is beneficial for several reasons including knowing about cancer risks, identifying potential screening and risk-reduction options that may be appropriate, and to understand if other family members could be at risk for cancer and allow them to undergo genetic testing.   We reviewed the characteristics, features and inheritance patterns of hereditary cancer syndromes. We also discussed genetic testing, including the appropriate family members to test, the process of testing, insurance coverage and turn-around-time for results. We discussed the implications of a negative, positive and/or  variant of uncertain significant result. We recommended Ms. Araque pursue genetic testing for the [facility] CancerNext-Expanded+RNA gene panel.   The CancerNext-Expanded gene panel offered by [facility] and includes sequencing, rearrangement, and RNA analysis for the following 77 genes: AIP, ALK, APC, ATM, AXIN2, BAP1, BARD1, BMPR1A, BRCA1, BRCA2, BRIP1, CDC73, CDH1, CDK4, CDKN1B, CDKN2A, CEBPA, CHEK2, CTNNA1, DDX41, DICER1, ETV6, FH, FLCN, GATA2, LZTR1, MAX, MBD4, MEN1, MET, MLH1, MSH2, MSH3, MSH6, MUTYH, NF1, NF2, NTHL1, PALB2, PHOX2B, PMS2, POT1, PRKAR1A, PTCH1, PTEN, RAD51C, RAD51D, RB1, RET, RPS20, RUNX1, SDHA, SDHAF2, SDHB, SDHC, SDHD, SMAD4, SMARCA4, SMARCB1, SMARCE1, STK11, SUFU, TMEM127, TP53, TSC1, TSC2, VHL, and WT1 (sequencing and deletion/duplication); EGFR, HOXB13, KIT, MITF, PDGFRA, POLD1, and POLE (sequencing only); EPCAM and GREM1 (deletion/duplication only).   Based on Ms. Vendetti's personal and family history of cancer, she meets medical criteria for genetic testing. She has elected to pay the patient pay price for testing.   PLAN: After considering the risks, benefits, and limitations,  Ms. Kubicki provided informed consent to pursue genetic testing and the blood sample was sent to [facility] for analysis of the CancerNext-Expanded+RNA panel. Results should be available within approximately 2-3 weeks' time, at which point they will be disclosed by telephone to Ms. Huyser, as will any additional recommendations warranted by these results. Ms. Bertholf will receive a summary of her genetic counseling visit and a copy of her results once available. This information will also be available in Epic.   Ms. Avena's questions were answered to her satisfaction today. Our contact information was provided should additional questions or concerns arise. Thank you for the referral and allowing us  to share in the care of your patient.   Dena Cary, MS, [facility] Genetic  Counselor [email or web address] Phone: [phone]  25 minutes were spent on the date of the encounter in service to the patient including preparation, face-to-face consultation, documentation and care coordination. Dr. Delinda was available for discussion regarding this case.   _______________________________________________________________________ For Office Staff:  Number of people involved in session: 1 Was an Intern/ student involved with case: no

## 2024-04-07 DIAGNOSIS — M47812 Spondylosis without myelopathy or radiculopathy, cervical region: Secondary | ICD-10-CM | POA: Diagnosis not present

## 2024-04-07 DIAGNOSIS — G4733 Obstructive sleep apnea (adult) (pediatric): Secondary | ICD-10-CM | POA: Diagnosis not present

## 2024-04-13 ENCOUNTER — Ambulatory Visit: Payer: Self-pay | Admitting: Licensed Clinical Social Worker

## 2024-04-13 ENCOUNTER — Telehealth: Payer: Self-pay | Admitting: Licensed Clinical Social Worker

## 2024-04-13 ENCOUNTER — Encounter: Payer: Self-pay | Admitting: Licensed Clinical Social Worker

## 2024-04-13 DIAGNOSIS — Z1379 Encounter for other screening for genetic and chromosomal anomalies: Secondary | ICD-10-CM | POA: Insufficient documentation

## 2024-04-13 DIAGNOSIS — Z01419 Encounter for gynecological examination (general) (routine) without abnormal findings: Secondary | ICD-10-CM | POA: Diagnosis not present

## 2024-04-13 NOTE — Telephone Encounter (Signed)
 I contacted Ms. Urias to discuss her genetic testing results. No pathogenic variants were identified in the 77 genes analyzed. Detailed clinic note to follow.   The test report has been scanned into EPIC and is located under the Molecular Pathology section of the Results Review tab.  A portion of the result report is included below for reference.      Dena Cary, MS, [facility] Genetic Counselor [email or web address] Phone: [phone]

## 2024-04-13 NOTE — Progress Notes (Signed)
 HPI:   Diane White was previously seen in the [facility] due to a personal and family history of cancer and concerns regarding a hereditary predisposition to cancer. Please refer to our prior cancer genetics clinic note for more information regarding our discussion, assessment and recommendations, at the time. Diane White recent genetic test results were disclosed to Diane White, as were recommendations warranted by these results. These results and recommendations are discussed in more detail below.  CANCER HISTORY:  Oncology History   No history exists.    FAMILY HISTORY:  We obtained a detailed, 4-generation family history.  Significant diagnoses are listed below: Family History  Problem Relation Age of Onset   Hypertension Mother    Hyperlipidemia Mother    Heart disease Mother    Arthritis Mother    Pancreatic cancer Mother 65       negative genetic testing   Hypertension Father    Hyperlipidemia Father    Heart disease Father    Diabetes Father    Prostate cancer Father    Cancer Father        duodenal cancer 6s, T cell lymph of scalp   Melanoma Father    Hyperlipidemia Brother    Stroke Maternal Grandmother    Cancer Maternal Grandmother    Pancreatic cancer Maternal Grandfather    Leukemia Maternal Grandfather    Colon cancer Other 2       cousin   Ovarian cancer Other 14   Breast cancer Maternal Cousin    Diane White has 2 brothers, 1 sister, no cancers.   Diane White mother was recently diagnosed with pancreatic cancer at 34 and underwent genetic testing which was normal/negative. Patient has 2 maternal uncles who had skin cancer, maternal cousin who had colon cancer at 27, maternal cousin who had esophageal cancer, maternal cousin who had Hodgkin's lymphoma, and maternal cousin who had breast cancer. Maternal grandfather had pancreatic cancer and leukemia.    Diane White father had several cancers: melanoma, basal cell carcinoma, cutaneous T cell lymphoma on  scalp, prostate cancer (dx 59s), duodenal cancer (dx 74s).  Patient has 1 paternal uncle who had melanoma and other skin cancers, of note he had lots of sun exposure. His son died of brain tumors at 66. Patient has a paternal great aunt who had ovarian cancer at 30.   Diane White is aware of previous family history of genetic testing for hereditary cancer risks. There is no reported Ashkenazi Jewish ancestry. There is no known consanguinity.       GENETIC TEST RESULTS:  The [facility] CancerNext-Expanded+RNA Panel found no pathogenic mutations.   The CancerNext-Expanded gene panel offered by [facility] and includes sequencing, rearrangement, and RNA analysis for the following 77 genes: AIP, ALK, APC, ATM, AXIN2, BAP1, BARD1, BMPR1A, BRCA1, BRCA2, BRIP1, CDC73, CDH1, CDK4, CDKN1B, CDKN2A, CEBPA, CHEK2, CTNNA1, DDX41, DICER1, ETV6, FH, FLCN, GATA2, LZTR1, MAX, MBD4, MEN1, MET, MLH1, MSH2, MSH3, MSH6, MUTYH, NF1, NF2, NTHL1, PALB2, PHOX2B, PMS2, POT1, PRKAR1A, PTCH1, PTEN, RAD51C, RAD51D, RB1, RET, RPS20, RUNX1, SDHA, SDHAF2, SDHB, SDHC, SDHD, SMAD4, SMARCA4, SMARCB1, SMARCE1, STK11, SUFU, TMEM127, TP53, TSC1, TSC2, VHL, and WT1 (sequencing and deletion/duplication); EGFR, HOXB13, KIT, MITF, PDGFRA, POLD1, and POLE (sequencing only); EPCAM and GREM1 (deletion/duplication only).   The test report has been scanned into EPIC and is located under the Molecular Pathology section of the Results Review tab.  A portion of the result report is included below for reference. Genetic testing reported out on  04/12/2024.     Even though a pathogenic variant was not identified, possible explanations for the cancer in the family may include: There may be no hereditary risk for cancer in the family. The cancers in Diane White and/or Diane White family may be sporadic/familial or due to other genetic and environmental factors. There may be a gene mutation in one of these genes that current testing methods cannot detect but that chance  is small. There could be another gene that has not yet been discovered, or that we have not yet tested, that is responsible for the cancer diagnoses in the family.  It is also possible there is a hereditary cause for the cancer in the family that Diane White did not inherit. Therefore, it is important to remain in touch with cancer genetics in the future so that we can continue to offer Diane White the most up to date genetic testing.   ADDITIONAL GENETIC TESTING:  We discussed with Diane White that Diane White genetic testing was fairly extensive.  If there are additional relevant genes identified to increase cancer risk that can be analyzed in the future, we would be happy to discuss and coordinate this testing at that time.    CANCER SCREENING RECOMMENDATIONS:  Diane White test result is considered negative (normal).  This means that we have not identified a hereditary cause for Diane White personal and family history of cancer at this time.   An individual's cancer risk and medical management are not determined by genetic test results alone. Overall cancer risk assessment incorporates additional factors, including personal medical history, family history, and any available genetic information that may result in a personalized plan for cancer prevention and surveillance. Therefore, it is recommended she continue to follow the cancer management and screening guidelines provided by Diane White primary healthcare provider.  RECOMMENDATIONS FOR FAMILY MEMBERS:   Since she did not inherit a identifiable mutation in a cancer predisposition gene included on this panel, Diane White children could not have inherited a known mutation from Diane White in one of these genes. Individuals in this family might be at some increased risk of developing cancer, over the general population risk, due to the family history of cancer.  Individuals in the family should notify their providers of the family history of cancer. We recommend women in this family have a yearly  mammogram beginning at age 51, or 76 years younger than the earliest onset of cancer, an annual clinical breast exam, and perform monthly breast self-exams.  Family members should have colonoscopies by at age 20, or earlier, as recommended by their providers. Other members of the family may still carry a pathogenic variant in one of these genes that Diane White did not inherit. Based on the family history, we recommend Diane White maternal cousin who had colon cancer at 46/other relatives who have had cancer have genetic counseling and testing. Diane White will let us  know if we can be of any assistance in coordinating genetic counseling and/or testing for this family member.    FOLLOW-UP:  Lastly, we discussed with Diane White that cancer genetics is a rapidly advancing field and it is possible that new genetic tests will be appropriate for Diane White and/or Diane White family members in the future. We encouraged Diane White to remain in contact with cancer genetics on an annual basis so we can update Diane White personal and family histories and let Diane White know of advances in cancer genetics that may benefit this family.   Our contact number was provided. Diane White questions were  answered to Diane White satisfaction, and she knows she is welcome to call us  at anytime with additional questions or concerns.    Dena Cary, MS, [facility] Genetic Counselor [email or web address] Phone: [phone]

## 2024-04-15 DIAGNOSIS — M47812 Spondylosis without myelopathy or radiculopathy, cervical region: Secondary | ICD-10-CM | POA: Diagnosis not present

## 2024-05-05 DIAGNOSIS — H9071 Mixed conductive and sensorineural hearing loss, unilateral, right ear, with unrestricted hearing on the contralateral side: Secondary | ICD-10-CM | POA: Diagnosis not present

## 2024-05-06 DIAGNOSIS — M47812 Spondylosis without myelopathy or radiculopathy, cervical region: Secondary | ICD-10-CM | POA: Diagnosis not present

## 2024-05-08 DIAGNOSIS — G4733 Obstructive sleep apnea (adult) (pediatric): Secondary | ICD-10-CM | POA: Diagnosis not present

## 2024-05-12 ENCOUNTER — Encounter: Payer: Self-pay | Admitting: Family Medicine

## 2024-05-29 ENCOUNTER — Encounter: Payer: Self-pay | Admitting: Family Medicine

## 2024-06-24 DIAGNOSIS — L821 Other seborrheic keratosis: Secondary | ICD-10-CM | POA: Diagnosis not present

## 2024-06-24 DIAGNOSIS — L570 Actinic keratosis: Secondary | ICD-10-CM | POA: Diagnosis not present

## 2024-06-24 DIAGNOSIS — L718 Other rosacea: Secondary | ICD-10-CM | POA: Diagnosis not present

## 2024-06-24 DIAGNOSIS — D1801 Hemangioma of skin and subcutaneous tissue: Secondary | ICD-10-CM | POA: Diagnosis not present

## 2024-06-24 DIAGNOSIS — L728 Other follicular cysts of the skin and subcutaneous tissue: Secondary | ICD-10-CM | POA: Diagnosis not present

## 2024-07-14 DIAGNOSIS — M47812 Spondylosis without myelopathy or radiculopathy, cervical region: Secondary | ICD-10-CM | POA: Diagnosis not present

## 2024-09-08 ENCOUNTER — Ambulatory Visit: Admitting: Pulmonary Disease

## 2024-09-08 ENCOUNTER — Encounter: Payer: Self-pay | Admitting: Pulmonary Disease

## 2024-09-08 VITALS — BP 120/78 | HR 67 | Temp 97.1°F | Ht 72.0 in | Wt 176.8 lb

## 2024-09-08 DIAGNOSIS — G4733 Obstructive sleep apnea (adult) (pediatric): Secondary | ICD-10-CM

## 2024-09-08 NOTE — Patient Instructions (Signed)
 Download from your machine shows it is working well-controlling events optimally  Will see you on a yearly basis  Give us  a call with any significant concerns  Continue using your CPAP on a nightly basis

## 2024-09-08 NOTE — Progress Notes (Signed)
 "              Diane White    [id number]    1966/04/29  Primary Care Physician:Duncan, Arlyss RAMAN, MD  Referring Physician: Cleatus Arlyss RAMAN, MD [id number] [place],  [place] [address]  Chief complaint:   Follow-up for obstructive sleep apnea  HPI:  Tolerating CPAP well - CPAP currently at CPAP of 4 - AHI is well-controlled  No daytime fatigue or sleepiness  Chronic eardrum issues have been stable  No significant changes in her health  Outpatient Encounter Medications as of 09/08/2024  Medication Sig   Calcium-Vitamin D-Vitamin K (CALCIUM SOFT CHEWS) [phone] MG-UNT-MCG CHEW Chew 1 tablet by mouth daily.   Cholecalciferol 50 MCG (2000 UT) CAPS Take by mouth.   Magnesium  100 MG TABS Take 2 tablets (200 mg total) by mouth daily. (Patient taking differently: Take 300 mg by mouth daily.)   promethazine  (PROMETHEGAN) 25 MG suppository Place 1 suppository (25 mg total) rectally every 6 (six) hours as needed for nausea or vomiting.   SUMAtriptan  (IMITREX ) 50 MG tablet Take as needed for migraine.  Can repeat with second dose in 2 hours if needed.  Max 2 doses in 24 hours.   No facility-administered encounter medications on file as of 09/08/2024.    Allergies as of 09/08/2024   (No Known Allergies)    Past Medical History:  Diagnosis Date   Chicken pox    History of breast cancer    Lumpectomy and radiation at age 68.   History of skin cancer    Status post Mohs surgery, right hand.   Inflammatory polyps of colon with unspecified complications (HCC)    Migraines    OSA (obstructive sleep apnea)     Past Surgical History:  Procedure Laterality Date   BREAST BIOPSY     BREAST LUMPECTOMY     INGUINAL HERNIA REPAIR Right    NASAL SINUS SURGERY     Maxillary antrostomy 2012   TONSILLECTOMY AND ADENOIDECTOMY     TYMPANOSTOMY TUBE PLACEMENT     Multiple times, along with eardrum repair   VARICOSE VEIN SURGERY     injection and sclerotherapy    Family History  Problem  Relation Age of Onset   Hypertension Mother    Hyperlipidemia Mother    Heart disease Mother    Arthritis Mother    Pancreatic cancer Mother 8       negative genetic testing   Hypertension Father    Hyperlipidemia Father    Heart disease Father    Diabetes Father    Prostate cancer Father    Cancer Father        duodenal cancer 10s, T cell lymph of scalp   Melanoma Father    Hyperlipidemia Brother    Stroke Maternal Grandmother    Cancer Maternal Grandmother    Pancreatic cancer Maternal Grandfather    Leukemia Maternal Grandfather    Colon cancer Other 22       cousin   Ovarian cancer Other 50   Breast cancer Maternal Cousin     Social History   Socioeconomic History   Marital status: Married    Spouse name: Not on file   Number of children: Not on file   Years of education: Not on file   Highest education level: Master's degree (e.g., MA, MS, MEng, MEd, MSW, MBA)  Occupational History   Not on file  Tobacco Use   Smoking status: Never   Smokeless tobacco:  Never  Substance and Sexual Activity   Alcohol use: Yes    Comment: rare   Drug use: Never   Sexual activity: Yes  Other Topics Concern   Not on file  Social History Narrative   Married 2008   From [place] .   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[employer], speech pathology   Social Drivers of Health   Tobacco Use: Low Risk (09/08/2024)   Patient History    Smoking Tobacco Use: Never    Smokeless Tobacco Use: Never    Passive Exposure: Not on file  Financial Resource Strain: Low Risk  (04/13/2024)   Received from [facility]   Overall Financial Resource Strain (CARDIA)    Difficulty of Paying Living Expenses: Not hard at all  Food Insecurity: No Food Insecurity (04/13/2024)   Received from [facility]   Epic    Within the past 12 months, you worried that your food would run out before you got the money to buy more.: Never true    Within the  past 12 months, the food you bought just didn't last and you didn't have money to get more.: Never true  Transportation Needs: No Transportation Needs (04/13/2024)   Received from [facility] - Transportation    In the past 12 months, has lack of transportation kept you from medical appointments or from getting medications?: No    Lack of Transportation (Non-Medical): No  Physical Activity: Sufficiently Active (09/11/2023)   Exercise Vital Sign    Days of Exercise per Week: 6 days    Minutes of Exercise per Session: 50 min  Stress: No Stress Concern Present (09/11/2023)   [employer] of Occupational [employer] - Occupational Stress Questionnaire    Feeling of Stress : Not at all  Social Connections: Socially Integrated (09/11/2023)   Social Connection and Isolation Panel    Frequency of Communication with Friends and Family: More than three times a week    Frequency of Social Gatherings with Friends and Family: More than three times a week    Attends Religious Services: More than 4 times per year    Active Member of Clubs or Organizations: Yes    Attends Banker Meetings: More than 4 times per year    Marital Status: Married  Catering Manager Violence: Not on file  Depression (PHQ2-9): Low Risk (11/10/2023)   Depression (PHQ2-9)    PHQ-2 Score: 0  Alcohol Screen: Low Risk (09/11/2023)   Alcohol Screen    Last Alcohol Screening Score (AUDIT): 1  Housing: Low Risk  (04/13/2024)   Received from [facility]   Epic    In the last 12 months, was there a time when you were not able to pay the mortgage or rent on time?: No    In the past 12 months, how many times have you moved where you were living?: 0    At any time in the past 12 months, were you homeless or living in a shelter (including now)?: No  Utilities: Not At Risk (04/13/2024)   Received from [facility]   Epic    In the past 12 months has the electric,  gas, oil, or water company threatened to shut off services in your home?: No  Health Literacy: Not on file    Review of Systems  Respiratory:  Positive for apnea.   Psychiatric/Behavioral:  Positive for sleep  disturbance.     Vitals:   09/08/24 1333  BP: 120/78  Pulse: 67  Temp: (!) 97.1 F (36.2 C)  SpO2: 98%     Physical Exam Constitutional:      Appearance: Normal appearance.  HENT:     Head: Normocephalic.  Eyes:     General: No scleral icterus. Cardiovascular:     Rate and Rhythm: Normal rate and regular rhythm.     Heart sounds: No murmur heard. Pulmonary:     Effort: No respiratory distress.     Breath sounds: No stridor. No wheezing or rhonchi.  Neurological:     Mental Status: She is alert.  Psychiatric:        Mood and Affect: Mood normal.    Data Reviewed: Compliance data reviewed showing excellent compliance at 90% Average use of 7 hours 6 minutes CPAP of 4 Residual AHI 1.8  Assessment/Plan: Moderate obstructive sleep apnea - Appears well-controlled with CPAP use - Compliant with CPAP use - Not having significant issues -Continues to benefit from CPAP  With stable symptoms and control of events - Follow-up a year from now  Call us  with significant concerns      Jennet Epley MD [facility] 09/08/2024, 2:00 PM  CC: Cleatus Arlyss RAMAN, MD    "
